# Patient Record
Sex: Female | Born: 1995 | Race: Black or African American | Hispanic: No | Marital: Single | Smoking: Never smoker
Health system: Southern US, Community
[De-identification: ages and names within clinical notes are randomized; demographics above are authoritative.]

## PROBLEM LIST (undated history)

## (undated) DIAGNOSIS — M5430 Sciatica, unspecified side: Secondary | ICD-10-CM

## (undated) DIAGNOSIS — D649 Anemia, unspecified: Secondary | ICD-10-CM

## (undated) HISTORY — PX: WISDOM TOOTH EXTRACTION: SHX21

## (undated) HISTORY — PX: TONSILLECTOMY: SUR1361

---
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
## 2016-07-11 ENCOUNTER — Emergency Department (HOSPITAL_BASED_OUTPATIENT_CLINIC_OR_DEPARTMENT_OTHER): Payer: Medicaid Other

## 2016-07-11 ENCOUNTER — Emergency Department (HOSPITAL_BASED_OUTPATIENT_CLINIC_OR_DEPARTMENT_OTHER)
Admission: EM | Admit: 2016-07-11 | Discharge: 2016-07-11 | Disposition: A | Discharge status: Home / Self Care | Payer: Medicaid Other | Attending: Emergency Medicine | Admitting: Emergency Medicine

## 2016-07-11 ENCOUNTER — Encounter (HOSPITAL_BASED_OUTPATIENT_CLINIC_OR_DEPARTMENT_OTHER): Payer: Self-pay | Admitting: Emergency Medicine

## 2016-07-11 DIAGNOSIS — R0789 Other chest pain: Secondary | ICD-10-CM | POA: Diagnosis not present

## 2016-07-11 DIAGNOSIS — R0602 Shortness of breath: Secondary | ICD-10-CM | POA: Insufficient documentation

## 2016-07-11 LAB — PREGNANCY, URINE: PREG TEST UR: NEGATIVE

## 2016-07-11 NOTE — Discharge Instructions (Signed)

## 2016-07-11 NOTE — ED Provider Notes (Signed)
[facility] Provider Note   CSN: [id number] Arrival date & time: 07/11/16  1348     History   Chief Complaint Chief Complaint  Patient presents with  . Chest Pain    HPI Tracey Dunn is a 21 y.o. female.  21 yo F with a cc of chest pain. Cytologic she was at work. Pain is described as sharp worse with deep breaths. Denies injury denies cough or fever. Denies history of PE or DVT. Denies lower extremity edema. Denies recent surgery or hospitalization. Denies recent travel. Denies oral contraceptive use. Patient is a smoker. Denies family history of MI.   The history is provided by the patient.  Chest Pain   This is a new problem. The current episode started 1 to 2 hours ago. The problem occurs constantly. The problem has not changed since onset.The pain is associated with breathing. The pain is present in the substernal region. The pain is at a severity of 6/10. The pain is moderate. The quality of the pain is described as sharp. The pain does not radiate. Duration of episode(s) is 2 hours. The symptoms are aggravated by deep breathing. Associated symptoms include shortness of breath. Pertinent negatives include no abdominal pain, no dizziness, no fever, no headaches, no nausea, no palpitations and no vomiting. She has tried nothing for the symptoms. The treatment provided no relief. Risk factors include smoking/tobacco exposure.  Pertinent negatives for past medical history include no DVT, no hyperlipidemia, no hypertension, no MI and no PE.    Past Medical History:  Diagnosis Date  . Anemia     Patient Active Problem List   Diagnosis Date Noted  . Anemia 04/01/2015  . Menorrhagia 04/01/2015  . Obesity, morbid, BMI 40.0-49.9 (HCC) 04/01/2015    Past Surgical History:  Procedure Laterality Date  . TONSILLECTOMY    . WISDOM TOOTH EXTRACTION      OB History    Gravida Para Term Preterm AB Living   0 0 0 0 0 0   SAB TAB Ectopic Multiple Live Births   0 0 0 0          Home Medications    Prior to Admission medications   Medication Sig Start Date End Date Taking? Authorizing Provider  ferrous sulfate 325 (65 FE) MG tablet Take 325 mg by mouth daily with breakfast.    Historical Provider, MD  ibuprofen (ADVIL,MOTRIN) 800 MG tablet Take 1 tablet (800 mg total) by mouth 3 (three) times daily. 03/24/15   Eber HongBrian Miller, MD  Norgestimate-Ethinyl Estradiol Triphasic (ORTHO TRI-CYCLEN LO) 0.18/0.215/0.25 MG-25 MCG tab Take 1 tablet by mouth daily. 04/01/15   Adam PhenixJames G Arnold, MD    Family History Family History  Problem Relation Age of Onset  . Hypertension Mother   . Diabetes Mother     Type 1  . Hypertension Maternal Grandmother   . Cancer Neg Hx   . Stroke Neg Hx     Social History Social History  Substance Use Topics  . Smoking status: Never Smoker  . Smokeless tobacco: Never Used  . Alcohol use No     Allergies   Sulfa antibiotics   Review of Systems Review of Systems  Constitutional: Negative for chills and fever.  HENT: Negative for congestion and rhinorrhea.   Eyes: Negative for redness and visual disturbance.  Respiratory: Positive for shortness of breath. Negative for wheezing.   Cardiovascular: Positive for chest pain. Negative for palpitations.  Gastrointestinal: Negative for abdominal pain, nausea and vomiting.  Genitourinary: Negative for dysuria and urgency.  Musculoskeletal: Negative for arthralgias and myalgias.  Skin: Negative for pallor and wound.  Neurological: Negative for dizziness and headaches.     Physical Exam Updated Vital Signs BP 125/72 (BP Location: Left Arm)   Pulse 81   Temp 98.7 F (37.1 C) (Oral)   Resp 17   Ht 5\' 4"  (1.626 m)   Wt 230 lb (104.3 kg)   LMP 06/22/2016 Comment: pt reports irregular cycle  SpO2 100%   BMI 39.48 kg/m   Physical Exam  Constitutional: She is oriented to person, place, and time. She appears well-developed and well-nourished. No distress.  HENT:  Head:  Normocephalic and atraumatic.  Eyes: EOM are normal. Pupils are equal, round, and reactive to light.  Neck: Normal range of motion. Neck supple.  Cardiovascular: Normal rate and regular rhythm.  Exam reveals no gallop and no friction rub.   No murmur heard. Pulmonary/Chest: Effort normal. She has no wheezes. She has no rales. She exhibits tenderness (reproduces pain).  Abdominal: Soft. She exhibits no distension and no mass. There is no tenderness. There is no guarding.  Musculoskeletal: She exhibits no edema or tenderness.  Neurological: She is alert and oriented to person, place, and time.  Skin: Skin is warm and dry. She is not diaphoretic.  Psychiatric: She has a normal mood and affect. Her behavior is normal.  Nursing note and vitals reviewed.    ED Treatments / Results  Labs (all labs ordered are listed, but only abnormal results are displayed) Labs Reviewed  PREGNANCY, URINE    EKG  EKG Interpretation  Date/Time:  Saturday July 11 2016 14:00:27 EDT Ventricular Rate:  88 PR Interval:  126 QRS Duration: 76 QT Interval:  352 QTC Calculation: 425 R Axis:   53 Text Interpretation:  Normal sinus rhythm with sinus arrhythmia Abnormal ECG flipped t waves resolved from prior Otherwise no significant change Confirmed by Charley Lafrance MD, DANIEL [phone]) on 07/11/2016 2:12:35 PM       Radiology Dg Chest 2 View  Result Date: 07/11/2016 CLINICAL DATA:  Chest tightness EXAM: CHEST  2 VIEW COMPARISON:  March 23, 2015 FINDINGS: Lungs are clear. Heart size and pulmonary vascularity are normal. No adenopathy. No pneumothorax. No bone lesions. IMPRESSION: No edema or consolidation. Electronically Signed   By: Bretta Bang III M.D.   On: 07/11/2016 14:44    Procedures Procedures (including critical care time)  Medications Ordered in ED Medications - No data to display   Initial Impression / Assessment and Plan / ED Course  I have reviewed the triage vital signs and the nursing  notes.  Pertinent labs & imaging results that were available during my care of the patient were reviewed by me and considered in my medical decision making (see chart for details).     21 yo F With a chief complaint of chest pain. Reproduced on exam. Suspect muscular skeletal. EKG with no significant finding. PERC negative.   CXR negative, d/c home.  2:59 PM:  I have discussed the diagnosis/risks/treatment options with the patient and family and believe the pt to be eligible for discharge home to follow-up with PCP. We also discussed returning to the ED immediately if new or worsening sx occur. We discussed the sx which are most concerning (e.g., sudden worsening pain, fever, inability to tolerate by mouth) that necessitate immediate return. Medications administered to the patient during their visit and any new prescriptions provided to the patient are listed below.  Medications  given during this visit Medications - No data to display   The patient appears reasonably screen and/or stabilized for discharge and I doubt any other medical condition or other [facility] requiring further screening, evaluation, or treatment in the ED at this time prior to discharge.    Final Clinical Impressions(s) / ED Diagnoses   Final diagnoses:  Chest wall pain    New Prescriptions New Prescriptions   No medications on file     Melene Plan, DO 07/11/16 1459

## 2016-07-11 NOTE — ED Triage Notes (Signed)
Pt reports sudden central chest pain and tightness today around noon, along with some nausea upon standing during initial episode of chest pain and reports pain has been constant since,. Worse with deep inspiration.

## 2016-09-10 DIAGNOSIS — G4733 Obstructive sleep apnea (adult) (pediatric): Secondary | ICD-10-CM | POA: Insufficient documentation

## 2016-09-10 DIAGNOSIS — R0683 Snoring: Secondary | ICD-10-CM | POA: Insufficient documentation

## 2016-09-13 ENCOUNTER — Other Ambulatory Visit: Payer: Self-pay | Admitting: Obstetrics & Gynecology

## 2016-09-13 DIAGNOSIS — Z01419 Encounter for gynecological examination (general) (routine) without abnormal findings: Secondary | ICD-10-CM

## 2016-09-13 DIAGNOSIS — Z3009 Encounter for other general counseling and advice on contraception: Secondary | ICD-10-CM

## 2017-01-29 ENCOUNTER — Emergency Department (HOSPITAL_BASED_OUTPATIENT_CLINIC_OR_DEPARTMENT_OTHER): Payer: Self-pay

## 2017-01-29 ENCOUNTER — Encounter (HOSPITAL_BASED_OUTPATIENT_CLINIC_OR_DEPARTMENT_OTHER): Payer: Self-pay

## 2017-01-29 ENCOUNTER — Emergency Department (HOSPITAL_BASED_OUTPATIENT_CLINIC_OR_DEPARTMENT_OTHER)
Admission: EM | Admit: 2017-01-29 | Discharge: 2017-01-29 | Disposition: A | Discharge status: Home / Self Care | Payer: Self-pay | Attending: Emergency Medicine | Admitting: Emergency Medicine

## 2017-01-29 DIAGNOSIS — R1110 Vomiting, unspecified: Secondary | ICD-10-CM | POA: Insufficient documentation

## 2017-01-29 DIAGNOSIS — R1010 Upper abdominal pain, unspecified: Secondary | ICD-10-CM

## 2017-01-29 DIAGNOSIS — R197 Diarrhea, unspecified: Secondary | ICD-10-CM | POA: Insufficient documentation

## 2017-01-29 DIAGNOSIS — Z79899 Other long term (current) drug therapy: Secondary | ICD-10-CM | POA: Insufficient documentation

## 2017-01-29 DIAGNOSIS — R7989 Other specified abnormal findings of blood chemistry: Secondary | ICD-10-CM

## 2017-01-29 DIAGNOSIS — R945 Abnormal results of liver function studies: Secondary | ICD-10-CM | POA: Insufficient documentation

## 2017-01-29 DIAGNOSIS — A084 Viral intestinal infection, unspecified: Secondary | ICD-10-CM | POA: Insufficient documentation

## 2017-01-29 DIAGNOSIS — R509 Fever, unspecified: Secondary | ICD-10-CM | POA: Insufficient documentation

## 2017-01-29 LAB — COMPREHENSIVE METABOLIC PANEL
ALBUMIN: 3.7 g/dL (ref 3.5–5.0)
ALT: 83 U/L — AB (ref 14–54)
AST: 88 U/L — AB (ref 15–41)
Alkaline Phosphatase: 72 U/L (ref 38–126)
Anion gap: 8 (ref 5–15)
BUN: 6 mg/dL (ref 6–20)
CHLORIDE: 105 mmol/L (ref 101–111)
CO2: 25 mmol/L (ref 22–32)
CREATININE: 0.66 mg/dL (ref 0.44–1.00)
Calcium: 8.7 mg/dL — ABNORMAL LOW (ref 8.9–10.3)
GFR calc Af Amer: >60 mL/min (ref >60)
GFR calc non Af Amer: >60 mL/min (ref >60)
Glucose, Bld: 84 mg/dL (ref 65–99)
Potassium: 3.6 mmol/L (ref 3.5–5.1)
SODIUM: 138 mmol/L (ref 135–145)
Total Bilirubin: 0.6 mg/dL (ref 0.3–1.2)
Total Protein: 8 g/dL (ref 6.5–8.1)

## 2017-01-29 LAB — CBC WITH DIFFERENTIAL/PLATELET
Basophils Absolute: 0 10*3/uL (ref 0.0–0.1)
Basophils Relative: 0 %
EOS PCT: 1 %
Eosinophils Absolute: 0.1 10*3/uL (ref 0.0–0.7)
HEMATOCRIT: 40 % (ref 36.0–46.0)
HEMOGLOBIN: 12.5 g/dL (ref 12.0–15.0)
LYMPHS ABS: 6.4 10*3/uL — AB (ref 0.7–4.0)
LYMPHS PCT: 73 %
MCH: 23.4 pg — ABNORMAL LOW (ref 26.0–34.0)
MCHC: 31.3 g/dL (ref 30.0–36.0)
MCV: 74.9 fL — ABNORMAL LOW (ref 78.0–100.0)
MONOS PCT: 7 %
Monocytes Absolute: 0.6 10*3/uL (ref 0.1–1.0)
NEUTROS ABS: 1.7 10*3/uL (ref 1.7–7.7)
Neutrophils Relative %: 19 %
Platelets: 157 10*3/uL (ref 150–400)
RBC: 5.34 MIL/uL — AB (ref 3.87–5.11)
RDW: 17.5 % — AB (ref 11.5–15.5)
WBC: 8.8 10*3/uL (ref 4.0–10.5)

## 2017-01-29 LAB — LIPASE, BLOOD: Lipase: 24 U/L (ref 11–51)

## 2017-01-29 LAB — URINALYSIS, ROUTINE W REFLEX MICROSCOPIC
Bilirubin Urine: NEGATIVE
Glucose, UA: NEGATIVE mg/dL
HGB URINE DIPSTICK: NEGATIVE
Ketones, ur: NEGATIVE mg/dL
LEUKOCYTES UA: NEGATIVE
Nitrite: NEGATIVE
Protein, ur: NEGATIVE mg/dL
SPECIFIC GRAVITY, URINE: 1.015 (ref 1.005–1.030)
pH: 8.5 — ABNORMAL HIGH (ref 5.0–8.0)

## 2017-01-29 LAB — PREGNANCY, URINE: PREG TEST UR: NEGATIVE

## 2017-01-29 MED ORDER — ONDANSETRON HCL 4 MG/2ML IJ SOLN
4.0000 mg | Freq: Once | INTRAMUSCULAR | Status: AC
  Administered 2017-01-29: 4 mg via INTRAVENOUS
  Filled 2017-01-29: qty 2

## 2017-01-29 MED ORDER — GI COCKTAIL ~~LOC~~
30.0000 mL | Freq: Once | ORAL | Status: AC
  Administered 2017-01-29: 30 mL via ORAL
  Filled 2017-01-29: qty 30

## 2017-01-29 MED ORDER — PANTOPRAZOLE SODIUM 40 MG PO TBEC: 40.0000 mg | DELAYED_RELEASE_TABLET | Freq: Every day | ORAL | 0 refills | Status: DC

## 2017-01-29 MED ORDER — SODIUM CHLORIDE 0.9 % IV BOLUS (SEPSIS)
1000.0000 mL | Freq: Once | INTRAVENOUS | Status: AC
  Administered 2017-01-29: 1000 mL via INTRAVENOUS

## 2017-01-29 MED ORDER — ONDANSETRON 4 MG PO TBDP: 4.0000 mg | ORAL_TABLET | Freq: Three times a day (TID) | ORAL | 0 refills | Status: DC | PRN

## 2017-01-29 NOTE — ED Triage Notes (Signed)
C/o abd pain x 1 week, also c/o "mucus bloody cough then throw up" x 1 last night-NAD-steady gait

## 2017-01-29 NOTE — ED Provider Notes (Signed)
[facility] Provider [facility]   CSN: [id number] Arrival date & time: 01/29/17  1244     History   Chief Complaint Chief Complaint  Patient presents with  . Abdominal Pain    HPI Tracey Dunn is a 21 y.o. female.  HPI  21 year old female presents with upper abdominal pain. She states she's had this pain on and off for about one week. It lasts an hour or 2 when it comes. It feels like a pressure or tightness. Nothing seems to make it come or go. Last night started having diarrhea and has had numerous episodes of loose watery stools. She denies any fevers at home but had a temperature of 100 on arrival here. She had one episode of vomiting prior to coming here and states it was mostly mucus with a very small amount of blood. She states this was less than a teaspoon. There is still pain in her upper abdomen. Eating does not make it better or worse. No urinary symptoms. Pain is a 9/10. No chest pain or shortness of breath.  Past Medical History:  Diagnosis Date  . Anemia     Patient Active Problem List   Diagnosis Date Noted  . Anemia 04/01/2015  . Menorrhagia 04/01/2015  . Obesity, morbid, BMI 40.0-49.9 (HCC) 04/01/2015    Past Surgical History:  Procedure Laterality Date  . TONSILLECTOMY    . WISDOM TOOTH EXTRACTION      OB History    Gravida Para Term Preterm AB Living   0 0 0 0 0 0   SAB TAB Ectopic Multiple Live Births   0 0 0 0         Home Medications    Prior to Admission medications   Medication Sig Start Date End Date Taking? Authorizing Provider  ferrous sulfate 325 (65 FE) MG tablet Take 325 mg by mouth daily with breakfast.    [provider]  ondansetron (ZOFRAN ODT) 4 MG disintegrating tablet Take 1 tablet (4 mg total) by mouth every 8 (eight) hours as needed for nausea or vomiting. 01/29/17   Pricilla Loveless, MD  pantoprazole (PROTONIX) 40 MG tablet Take 1 tablet (40 mg total) by mouth daily. 01/29/17   Pricilla Loveless, MD     Family History Family History  Problem Relation Age of Onset  . Hypertension Mother   . Diabetes Mother        Type 1  . Hypertension Maternal Grandmother   . Cancer Neg Hx   . Stroke Neg Hx     Social History Social History  Substance Use Topics  . Smoking status: Never Smoker  . Smokeless tobacco: Never Used  . Alcohol use No     Allergies   Sulfa antibiotics   Review of Systems Review of Systems  Constitutional: Negative for fever.  Respiratory: Negative for shortness of breath.   Cardiovascular: Negative for chest pain.  Gastrointestinal: Positive for abdominal pain, diarrhea, nausea and vomiting. Negative for blood in stool.  Genitourinary: Negative for dysuria.  Musculoskeletal: Negative for back pain.  All other systems reviewed and are negative.    Physical Exam Updated Vital Signs BP 108/82 (BP Location: Left Arm)   Pulse 78   Temp 98.6 F (37 C) (Oral)   Resp 18   Ht  (1.626 m)   Wt 97.5 kg (215 lb)   LMP  (LMP Unknown)   SpO2 100%   BMI 36.90 kg/m   Physical Exam  Constitutional: She is oriented to  person, place, and time. She appears well-developed and well-nourished. No distress.  obese  HENT:  Head: Normocephalic and atraumatic.  Right Ear: External ear normal.  Left Ear: External ear normal.  Nose: Nose normal.  Eyes: Right eye exhibits no discharge. Left eye exhibits no discharge.  Cardiovascular: Normal rate, regular rhythm and normal heart sounds.   Pulmonary/Chest: Effort normal and breath sounds normal. She has no wheezes. She has no rales. She exhibits no tenderness.  Abdominal: Soft. There is tenderness in the right upper quadrant and epigastric area.  Mild RUQ tenderness, moderate epigastric tenderness  Neurological: She is alert and oriented to person, place, and time.  Skin: Skin is warm and dry. She is not diaphoretic.  Nursing note and vitals reviewed.    ED Treatments / Results  Labs (all labs ordered are  listed, but only abnormal results are displayed) Labs Reviewed  URINALYSIS, ROUTINE W REFLEX MICROSCOPIC - Abnormal; Notable for the following:       Result Value   pH 8.5 (*)    All other components within normal limits  COMPREHENSIVE METABOLIC PANEL - Abnormal; Notable for the following:    Calcium 8.7 (*)    AST 88 (*)    ALT 83 (*)    All other components within normal limits  CBC WITH DIFFERENTIAL/PLATELET - Abnormal; Notable for the following:    RBC 5.34 (*)    MCV 74.9 (*)    MCH 23.4 (*)    RDW 17.5 (*)    Lymphs Abs 6.4 (*)    All other components within normal limits  PREGNANCY, URINE  LIPASE, BLOOD  PATHOLOGIST SMEAR REVIEW    EKG  EKG Interpretation None       Radiology US Abdomen Limited Ruq  Result Date: 01/29/2017 CLINICAL DATA:  Epigastric pain. EXAM: ULTRASOUND ABDOMEN LIMITED RIGHT UPPER QUADRANT COMPARISON:  No recent prior . FINDINGS: Gallbladder: No gallstones or wall thickening visualized. No sonographic Murphy sign noted by sonographer. Common bile duct: Diameter: 2 mm Liver: No focal lesion identified. Within normal limits in parenchymal echogenicity. Portal vein is patent on color Doppler imaging with normal direction of blood flow towards the liver. IMPRESSION: Negative exam. Electronically Signed   By: Maisie Fus  Register   On: 01/29/2017 17:33    Procedures Procedures (including critical care time)  Medications Ordered in ED Medications  sodium chloride 0.9 % bolus 1,000 mL (0 mLs Intravenous Stopped 01/29/17 1805)  ondansetron (ZOFRAN) injection 4 mg (4 mg Intravenous Given 01/29/17 1610)  gi cocktail (Maalox,Lidocaine,Donnatal) (30 mLs Oral Given 01/29/17 1612)     Initial Impression / Assessment and Plan / ED Course  I have reviewed the triage vital signs and the nursing notes.  Pertinent labs & imaging results that were available during my care of the patient were reviewed by me and considered in my medical decision making (see chart  for details).     Patient's upper abdominal pain is likely gastritis. She will be treated with PPI. Given some right upper quadrant tenderness, ultrasound obtained which is unremarkable. She has minimal AST/ALT elevation. Will instruct not to take Tylenol or drink alcohol. Will need to follow-up with a PCP for recheck to ensure these are coming down. This could be from a mild gastroenteritis or mild hepatitis. Otherwise, the patient is well-appearing and feels better after treatments. With the diarrhea I think this is likely gastroenteritis. The one episode of vomiting she states was somewhat forceful and had a very minimal amount of blood.  Likely Mallory-Weiss. No further vomiting or hematemesis. Discussed return precautions.  Final Clinical Impressions(s) / ED Diagnoses   Final diagnoses:  Upper abdominal pain  Viral gastroenteritis  Abnormal LFTs    New Prescriptions Discharge Medication List as of 01/29/2017  5:53 PM    START taking these medications   Details  ondansetron (ZOFRAN ODT) 4 MG disintegrating tablet Take 1 tablet (4 mg total) by mouth every 8 (eight) hours as needed for nausea or vomiting., Starting Fri 01/29/2017, Print    pantoprazole (PROTONIX) 40 MG tablet Take 1 tablet (40 mg total) by mouth daily., Starting Fri 01/29/2017, Print         Pricilla Loveless, MD 01/29/17 1820

## 2017-01-29 NOTE — ED Notes (Signed)
Pt discharged to home with family. NAD.  

## 2017-01-29 NOTE — Discharge Instructions (Signed)
Your liver tests are mildly elevated today. Do NOT take TYLENOL/ACETAMINOPHEN as this is absorbed by the liver. Do not drink alcohol. See a primary care doctor for a recheck of your liver tests in about 1 week. Return to the ER if you develop worsening pain, vomiting or other new/concerning symptoms.

## 2017-01-29 NOTE — ED Notes (Signed)
Pt reports epigastric pain for a week diarrhea that started yesterday, vomiting started today. PT reports at least 10 episodes of diarrhea and vomited one time today.

## 2017-02-01 LAB — PATHOLOGIST SMEAR REVIEW: PATH REVIEW: REACTIVE

## 2017-06-30 ENCOUNTER — Encounter (HOSPITAL_BASED_OUTPATIENT_CLINIC_OR_DEPARTMENT_OTHER): Payer: Self-pay | Admitting: Emergency Medicine

## 2017-06-30 ENCOUNTER — Other Ambulatory Visit: Payer: Self-pay

## 2017-06-30 ENCOUNTER — Emergency Department (HOSPITAL_BASED_OUTPATIENT_CLINIC_OR_DEPARTMENT_OTHER)
Admission: EM | Admit: 2017-06-30 | Discharge: 2017-06-30 | Disposition: A | Discharge status: Other Acute Inpatient Hospital | Payer: Self-pay | Attending: Emergency Medicine | Admitting: Emergency Medicine

## 2017-06-30 ENCOUNTER — Emergency Department (HOSPITAL_BASED_OUTPATIENT_CLINIC_OR_DEPARTMENT_OTHER): Payer: Self-pay

## 2017-06-30 DIAGNOSIS — R202 Paresthesia of skin: Secondary | ICD-10-CM

## 2017-06-30 DIAGNOSIS — Z79899 Other long term (current) drug therapy: Secondary | ICD-10-CM | POA: Insufficient documentation

## 2017-06-30 DIAGNOSIS — N390 Urinary tract infection, site not specified: Secondary | ICD-10-CM | POA: Insufficient documentation

## 2017-06-30 LAB — URINALYSIS, ROUTINE W REFLEX MICROSCOPIC
Bilirubin Urine: NEGATIVE
Glucose, UA: NEGATIVE mg/dL
Ketones, ur: 15 mg/dL — AB
Nitrite: NEGATIVE
PROTEIN: 30 mg/dL — AB
Specific Gravity, Urine: >1.03 — ABNORMAL HIGH (ref 1.005–1.030)
pH: 6.5 (ref 5.0–8.0)

## 2017-06-30 LAB — CBC WITH DIFFERENTIAL/PLATELET
Basophils Absolute: 0 K/uL (ref 0.0–0.1)
Basophils Relative: 0 %
Eosinophils Absolute: 0.2 K/uL (ref 0.0–0.7)
Eosinophils Relative: 2 %
HCT: 38.1 % (ref 36.0–46.0)
Hemoglobin: 12 g/dL (ref 12.0–15.0)
Lymphocytes Relative: 43 %
Lymphs Abs: 3.7 K/uL (ref 0.7–4.0)
MCH: 24 pg — ABNORMAL LOW (ref 26.0–34.0)
MCHC: 31.5 g/dL (ref 30.0–36.0)
MCV: 76 fL — ABNORMAL LOW (ref 78.0–100.0)
Monocytes Absolute: 0.7 K/uL (ref 0.1–1.0)
Monocytes Relative: 8 %
Neutro Abs: 4 K/uL (ref 1.7–7.7)
Neutrophils Relative %: 47 %
Platelets: 232 K/uL (ref 150–400)
RBC: 5.01 MIL/uL (ref 3.87–5.11)
RDW: 15.5 % (ref 11.5–15.5)
WBC: 8.6 K/uL (ref 4.0–10.5)

## 2017-06-30 LAB — URINALYSIS, MICROSCOPIC (REFLEX)

## 2017-06-30 LAB — COMPREHENSIVE METABOLIC PANEL
ALT: 20 U/L (ref 14–54)
AST: 21 U/L (ref 15–41)
Albumin: 3.8 g/dL (ref 3.5–5.0)
Alkaline Phosphatase: 67 U/L (ref 38–126)
Anion gap: 8 (ref 5–15)
BUN: 12 mg/dL (ref 6–20)
CHLORIDE: 106 mmol/L (ref 101–111)
CO2: 25 mmol/L (ref 22–32)
CREATININE: 0.64 mg/dL (ref 0.44–1.00)
Calcium: 8.9 mg/dL (ref 8.9–10.3)
Glucose, Bld: 88 mg/dL (ref 65–99)
POTASSIUM: 3.7 mmol/L (ref 3.5–5.1)
Sodium: 139 mmol/L (ref 135–145)
Total Bilirubin: 0.4 mg/dL (ref 0.3–1.2)
Total Protein: 7.6 g/dL (ref 6.5–8.1)

## 2017-06-30 LAB — MAGNESIUM: MAGNESIUM: 2.1 mg/dL (ref 1.7–2.4)

## 2017-06-30 LAB — PREGNANCY, URINE: Preg Test, Ur: NEGATIVE

## 2017-06-30 MED ORDER — CEPHALEXIN 250 MG PO CAPS
500.0000 mg | ORAL_CAPSULE | Freq: Once | ORAL | Status: AC
  Administered 2017-06-30: 500 mg via ORAL
  Filled 2017-06-30: qty 2

## 2017-06-30 NOTE — ED Notes (Signed)
Pt in room on monitor up to BR to void

## 2017-06-30 NOTE — ED Notes (Signed)
MD stated that patient can go to [facility] by POV.

## 2017-06-30 NOTE — ED Notes (Signed)
[facility]. [facility] going [facility] for [facility] via POV.

## 2017-06-30 NOTE — ED Notes (Signed)
Pt has not arrived at [facility] ED at this time. Pt taken off track board.

## 2017-06-30 NOTE — ED Triage Notes (Signed)
Left arm and hand numb started today about 45 mins ago  Neg VAN has equal grip , no slurred speech ,  Can feel equal and well both arms and hands   States got lightheaded, pt drove herself here she states

## 2017-06-30 NOTE — ED Provider Notes (Signed)
[facility] EMERGENCY DEPARTMENT Provider Note   CSN: [id number] Arrival date & time: 06/30/17  1402     History   Chief Complaint Chief Complaint  Patient presents with  . Numbness    HPI Tracey Dunn is a 22 y.o. female.  HPI Patient presents with left-sided numbness that started today while at work.  Patient states the symptoms started around 1 PM.  Denies any visual or speech changes.  States the numbness in her leg has improved though she still has numbness in her left arm.  Question mild weakness in left hand.  No recent injuries.  No fever or chills.  No previously similar symptoms.  Denies headache. Past Medical History:  Diagnosis Date  . Anemia     Patient Active Problem List   Diagnosis Date Noted  . Anemia 04/01/2015  . Menorrhagia 04/01/2015  . Obesity, morbid, BMI 40.0-49.9 (HCC) 04/01/2015    Past Surgical History:  Procedure Laterality Date  . TONSILLECTOMY    . WISDOM TOOTH EXTRACTION      OB History    Gravida Para Term Preterm AB Living   0 0 0 0 0 0   SAB TAB Ectopic Multiple Live Births   0 0 0 0         Home Medications    Prior to Admission medications   Medication Sig Start Date End Date Taking? Authorizing Provider  ferrous sulfate 325 (65 FE) MG tablet Take 325 mg by mouth daily with breakfast.    [provider]  ondansetron (ZOFRAN ODT) 4 MG disintegrating tablet Take 1 tablet (4 mg total) by mouth every 8 (eight) hours as needed for nausea or vomiting. 01/29/17   Pricilla LovelessGoldston, Scott, MD  pantoprazole (PROTONIX) 40 MG tablet Take 1 tablet (40 mg total) by mouth daily. 01/29/17   Pricilla LovelessGoldston, Scott, MD    Family History Family History  Problem Relation Age of Onset  . Hypertension Mother   . Diabetes Mother        Type 1  . Hypertension Maternal Grandmother   . Cancer Neg Hx   . Stroke Neg Hx     Social History Social History   Tobacco Use  . Smoking status: Never Smoker  . Smokeless tobacco: Never Used    Substance Use Topics  . Alcohol use: No  . Drug use: No     Allergies   Sulfa antibiotics   Review of Systems Review of Systems  Constitutional: Negative for chills, fatigue and fever.  HENT: Negative for sinus pressure and trouble swallowing.   Eyes: Negative for visual disturbance.  Respiratory: Negative for cough and shortness of breath.   Cardiovascular: Negative for chest pain, palpitations and leg swelling.  Gastrointestinal: Negative for abdominal pain, diarrhea, nausea and vomiting.  Musculoskeletal: Negative for arthralgias, back pain, gait problem, myalgias and neck pain.  Skin: Negative for rash and wound.  Neurological: Positive for weakness and numbness. Negative for dizziness, syncope, speech difficulty, light-headedness and headaches.  All other systems reviewed and are negative.    Physical Exam Updated Vital Signs BP 127/89   Pulse (!) 101   Temp 98.1 F (36.7 C) (Oral)   Resp 20   Ht 5\' 5"  (1.651 m)   Wt 119.4 kg (263 lb 3.7 oz)   LMP 06/05/2017   SpO2 100%   BMI 43.80 kg/m   Physical Exam  Constitutional: She is oriented to person, place, and time. She appears well-developed and well-nourished. No distress.  HENT:  Head: Normocephalic and atraumatic.  Mouth/Throat: Oropharynx is clear and moist. No oropharyngeal exudate.  Cranial nerves II through XII intact.  Eyes: EOM are normal. Pupils are equal, round, and reactive to light.  Neck: Normal range of motion. Neck supple. No JVD present. No tracheal deviation present. No thyromegaly present.  No Meningismus.  No posterior midline cervical tenderness to palpation.  Cardiovascular: Normal rate and regular rhythm. Exam reveals no gallop and no friction rub.  No murmur heard. Pulmonary/Chest: Effort normal and breath sounds normal. No stridor. No respiratory distress. She has no wheezes. She has no rales. She exhibits no tenderness.  Abdominal: Soft. Bowel sounds are normal. There is no tenderness.  There is no rebound and no guarding.  Musculoskeletal: Normal range of motion. She exhibits no edema or tenderness.  Distal pulses are 2+.  No extremity swelling or asymmetry.  Lymphadenopathy:    She has no cervical adenopathy.  Neurological: She is alert and oriented to person, place, and time.  Question mild left arm weakness compared to right.  Decreased sensation to light touch in the left arm compared to right.  5/5 bilateral lower extremity strength.  Sensation intact.  Skin: Skin is warm and dry. Capillary refill takes less than 2 seconds. No rash noted. She is not diaphoretic. No erythema.  Psychiatric: She has a normal mood and affect. Her behavior is normal.  Nursing note and vitals reviewed.    ED Treatments / Results  Labs (all labs ordered are listed, but only abnormal results are displayed) Labs Reviewed  CBC WITH DIFFERENTIAL/PLATELET - Abnormal; Notable for the following components:      Result Value   MCV 76.0 (*)    MCH 24.0 (*)    All other components within normal limits  URINALYSIS, ROUTINE W REFLEX MICROSCOPIC - Abnormal; Notable for the following components:   APPearance CLOUDY (*)    Specific Gravity, Urine >1.030 (*)    Hgb urine dipstick TRACE (*)    Ketones, ur 15 (*)    Protein, ur 30 (*)    Leukocytes, UA MODERATE (*)    All other components within normal limits  URINALYSIS, MICROSCOPIC (REFLEX) - Abnormal; Notable for the following components:   Bacteria, UA MANY (*)    Squamous Epithelial / LPF 6-30 (*)    All other components within normal limits  COMPREHENSIVE METABOLIC PANEL  MAGNESIUM  PREGNANCY, URINE    EKG  EKG Interpretation  Date/Time:  Wednesday June 30 2017 14:14:55 EDT Ventricular Rate:  102 PR Interval:  128 QRS Duration: 76 QT Interval:  328 QTC Calculation: 427 R Axis:   50 Text Interpretation:  Sinus tachycardia Otherwise normal ECG Confirmed by Loren Racer ([id number]) on 06/30/2017 7:10:12 PM       Radiology Ct  Head Wo Contrast  Result Date: 06/30/2017 CLINICAL DATA:  Left sided body numbness EXAM: CT HEAD WITHOUT CONTRAST TECHNIQUE: Contiguous axial images were obtained from the base of the skull through the vertex without intravenous contrast. COMPARISON:  None. FINDINGS: Brain: The ventricles are normal in size and configuration. There is no intracranial mass, hemorrhage, extra-axial fluid collection, or midline shift. The gray-white compartments are normal. No evident acute infarct. Vascular: No hyperdense vessel evident. No appreciable vascular calcification. Skull: The bony calvarium appears intact. Sinuses/Orbits: Visualized paranasal sinuses are clear. Visualized orbits appear symmetric bilaterally. Other: Visualized mastoid air cells are clear. IMPRESSION: Study within normal limits. Electronically Signed   By: Bretta Bang III M.D.   On: 06/30/2017 15:04  Procedures Procedures (including critical care time)  Medications Ordered in ED Medications  cephALEXin (KEFLEX) capsule 500 mg (500 mg Oral Given 06/30/17 1858)     Initial Impression / Assessment and Plan / ED Course  I have reviewed the triage vital signs and the nursing notes.  Pertinent labs & imaging results that were available during my care of the patient were reviewed by me and considered in my medical decision making (see chart for details).    CT head is normal.  Patient continues to have mostly sensory deficits in the left arm.  Possibly mild left grip strength weakness.  Very low suspicion for stroke in this 22 year old without significant risk factors.  She does have evidence of urinary tract infection on workup.  Will arrange to have patient get MRI at [facility] this evening.  Discussed with Dr. Rush Landmark, ER physician.  Aware that the patient is being transferred.  Patient is requesting to go by private vehicle.  She has a friend to drive.  Given first dose of Keflex in the emergency department.  Will need prescription  for 5 days of Keflex and outpatient referral to neurology if her MRI is normal.   Final Clinical Impressions(s) / ED Diagnoses   Final diagnoses:  Paresthesia  Acute lower UTI    ED Discharge Orders    None       Loren Racer, MD 06/30/17 1912

## 2017-09-23 DIAGNOSIS — E663 Overweight: Secondary | ICD-10-CM | POA: Insufficient documentation

## 2017-09-26 ENCOUNTER — Encounter (HOSPITAL_BASED_OUTPATIENT_CLINIC_OR_DEPARTMENT_OTHER): Payer: Self-pay | Admitting: *Deleted

## 2017-09-26 ENCOUNTER — Emergency Department (HOSPITAL_BASED_OUTPATIENT_CLINIC_OR_DEPARTMENT_OTHER)
Admission: EM | Admit: 2017-09-26 | Discharge: 2017-09-26 | Disposition: A | Discharge status: Home / Self Care | Payer: Self-pay | Attending: Emergency Medicine | Admitting: Emergency Medicine

## 2017-09-26 ENCOUNTER — Other Ambulatory Visit: Payer: Self-pay

## 2017-09-26 DIAGNOSIS — Z79899 Other long term (current) drug therapy: Secondary | ICD-10-CM | POA: Insufficient documentation

## 2017-09-26 DIAGNOSIS — N390 Urinary tract infection, site not specified: Secondary | ICD-10-CM | POA: Insufficient documentation

## 2017-09-26 DIAGNOSIS — M545 Low back pain, unspecified: Secondary | ICD-10-CM

## 2017-09-26 LAB — URINALYSIS, ROUTINE W REFLEX MICROSCOPIC
BILIRUBIN URINE: NEGATIVE
GLUCOSE, UA: NEGATIVE mg/dL
KETONES UR: NEGATIVE mg/dL
NITRITE: NEGATIVE
PH: 5.5 (ref 5.0–8.0)
Protein, ur: NEGATIVE mg/dL
Specific Gravity, Urine: >1.03 — ABNORMAL HIGH (ref 1.005–1.030)

## 2017-09-26 LAB — URINALYSIS, MICROSCOPIC (REFLEX)

## 2017-09-26 LAB — PREGNANCY, URINE: Preg Test, Ur: NEGATIVE

## 2017-09-26 MED ORDER — IBUPROFEN 400 MG PO TABS
600.0000 mg | ORAL_TABLET | Freq: Once | ORAL | Status: AC
  Administered 2017-09-26: 01:00:00 600 mg via ORAL
  Filled 2017-09-26: qty 1

## 2017-09-26 MED ORDER — ONDANSETRON 4 MG PO TBDP: 4.0000 mg | ORAL_TABLET | Freq: Three times a day (TID) | ORAL | 0 refills | Status: DC | PRN

## 2017-09-26 MED ORDER — CEPHALEXIN 250 MG PO CAPS
500.0000 mg | ORAL_CAPSULE | Freq: Once | ORAL | Status: AC
  Administered 2017-09-26: 500 mg via ORAL
  Filled 2017-09-26: qty 2

## 2017-09-26 MED ORDER — IBUPROFEN 600 MG PO TABS: 600.0000 mg | ORAL_TABLET | Freq: Four times a day (QID) | ORAL | 0 refills | Status: DC | PRN

## 2017-09-26 MED ORDER — CEPHALEXIN 500 MG PO CAPS: 500.0000 mg | ORAL_CAPSULE | Freq: Three times a day (TID) | ORAL | 0 refills | Status: AC

## 2017-09-26 NOTE — ED Notes (Signed)
EDP at BS 

## 2017-09-26 NOTE — ED Triage Notes (Signed)
Here for L flank pain, onset ~2 weeks ago, was intermittent, now constant since last night, also mentions urinary frequency, no meds PTA, (denies other sx, including: NVD, fever, sob, dizziness, constipation, urgency, dysuria, hematuria, vaginal d/c, or abd pain). No PCP.   Alert, NAD, calm, interactive, resps e/u, speaking in clear complete sentences, no dyspnea noted, skin W&D, VSS.

## 2017-09-26 NOTE — ED Provider Notes (Signed)
[facility] EMERGENCY DEPARTMENT Provider Note   CSN: [id number] Arrival date & time: 09/26/17  0009     History   Chief Complaint Chief Complaint  Patient presents with  . Back Pain    HPI Tracey Dunn is a 22 y.o. female.  HPI   22 yo F here with left lower back pain. Pt states that 3 weeks ago, she was at work when she developed an aching, throbbing, positional left-sided lower back pain. Pain improved off work, but has since returned often when at work. She works at [employer] with frequent lifting/twisting. Pain does seem worse with movement and palpation. No alleviating factors and has not taken anything for it. Earlier today, sx began to occur at rest so she presents for evaluation. No midline pain. No radiation down the leg or back. Of note, tp does report associated urinary frequency and has a h/o UTIs. No vomiting, fever, or known h/o kidney stones or pyelonephritis. She is not currently sexually active and denies any vaginal bleeding or discharge or other complaints.  Past Medical History:  Diagnosis Date  . Anemia     Patient Active Problem List   Diagnosis Date Noted  . Anemia 04/01/2015  . Menorrhagia 04/01/2015  . Obesity, morbid, BMI 40.0-49.9 (HCC) 04/01/2015    Past Surgical History:  Procedure Laterality Date  . TONSILLECTOMY    . WISDOM TOOTH EXTRACTION       OB History    Gravida  0   Para  0   Term  0   Preterm  0   AB  0   Living  0     SAB  0   TAB  0   Ectopic  0   Multiple  0   Live Births               Home Medications    Prior to Admission medications   Medication Sig Start Date End Date Taking? Authorizing Provider  cephALEXin (KEFLEX) 500 MG capsule Take 1 capsule (500 mg total) by mouth 3 (three) times daily for 10 days. 09/26/17 10/06/17  Shaune Pollack, MD  ferrous sulfate 325 (65 FE) MG tablet Take 325 mg by mouth daily with breakfast.    [provider]  ibuprofen (ADVIL,MOTRIN) 600 MG tablet  Take 1 tablet (600 mg total) by mouth every 6 (six) hours as needed for moderate pain. 09/26/17   Shaune Pollack, MD  ondansetron (ZOFRAN ODT) 4 MG disintegrating tablet Take 1 tablet (4 mg total) by mouth every 8 (eight) hours as needed for nausea or vomiting. 09/26/17   Shaune Pollack, MD  pantoprazole (PROTONIX) 40 MG tablet Take 1 tablet (40 mg total) by mouth daily. 01/29/17   Pricilla Loveless, MD    Family History Family History  Problem Relation Age of Onset  . Hypertension Mother   . Diabetes Mother        Type 1  . Hypertension Maternal Grandmother   . Cancer Neg Hx   . Stroke Neg Hx     Social History Social History   Tobacco Use  . Smoking status: Never Smoker  . Smokeless tobacco: Never Used  Substance Use Topics  . Alcohol use: No  . Drug use: No     Allergies   Sulfa antibiotics   Review of Systems Review of Systems  Constitutional: Negative for chills and fever.  HENT: Negative for congestion, rhinorrhea and sore throat.   Eyes: Negative for visual disturbance.  Respiratory: Negative  for cough, shortness of breath and wheezing.   Cardiovascular: Negative for chest pain and leg swelling.  Gastrointestinal: Negative for abdominal pain, diarrhea, nausea and vomiting.  Genitourinary: Positive for frequency. Negative for dysuria, flank pain, vaginal bleeding and vaginal discharge.  Musculoskeletal: Positive for back pain. Negative for neck pain.  Skin: Negative for rash.  Allergic/Immunologic: Negative for immunocompromised state.  Neurological: Negative for syncope and headaches.  Hematological: Does not bruise/bleed easily.  All other systems reviewed and are negative.    Physical Exam Updated Vital Signs BP 137/79 (BP Location: Right Arm)   Pulse 97   Temp 98.7 F (37.1 C) (Oral)   Resp 18   Ht 5\' 4"  (1.626 m)   Wt 117.9 kg (260 lb)   LMP 08/18/2017 (Exact Date)   SpO2 97%   BMI 44.63 kg/m   Physical Exam  Constitutional: She is oriented to  person, place, and time. She appears well-developed and well-nourished. No distress.  HENT:  Head: Normocephalic and atraumatic.  Eyes: Conjunctivae are normal.  Neck: Neck supple.  Cardiovascular: Normal rate, regular rhythm and normal heart sounds. Exam reveals no friction rub.  No murmur heard. Pulmonary/Chest: Effort normal and breath sounds normal. No respiratory distress. She has no wheezes. She has no rales.  Abdominal: Soft. Normal appearance. She exhibits no distension. There is no tenderness. There is no CVA tenderness.  No abd TTP. No CVAT.  Musculoskeletal: She exhibits no edema.       Back:  No midline lower back pain or TTP.   Neurological: She is alert and oriented to person, place, and time. She exhibits normal muscle tone.  Distal strength and sensation intact b/l LE. Gait normal.  Skin: Skin is warm. Capillary refill takes less than 2 seconds.  Psychiatric: She has a normal mood and affect.  Nursing note and vitals reviewed.    ED Treatments / Results  Labs (all labs ordered are listed, but only abnormal results are displayed) Labs Reviewed  URINALYSIS, ROUTINE W REFLEX MICROSCOPIC - Abnormal; Notable for the following components:      Result Value   APPearance CLOUDY (*)    Specific Gravity, Urine >1.030 (*)    Hgb urine dipstick SMALL (*)    Leukocytes, UA MODERATE (*)    All other components within normal limits  URINALYSIS, MICROSCOPIC (REFLEX) - Abnormal; Notable for the following components:   Bacteria, UA MANY (*)    All other components within normal limits  URINE CULTURE  PREGNANCY, URINE    EKG None  Radiology No results found.  Procedures Procedures (including critical care time)  Medications Ordered in ED Medications  cephALEXin (KEFLEX) capsule 500 mg (500 mg Oral Given 09/26/17 0114)  ibuprofen (ADVIL,MOTRIN) tablet 600 mg (600 mg Oral Given 09/26/17 0114)     Initial Impression / Assessment and Plan / ED Course  I have reviewed the  triage vital signs and the nursing notes.  Pertinent labs & imaging results that were available during my care of the patient were reviewed by me and considered in my medical decision making (see chart for details).     22 yo F here with left lower back pain. Suspect her primary pain is 2/2 MSK strain in setting of work injury. This occurred and worsens while at work and is reproducible upon palpation of paraspinal musculature. No midline TTP. No LE weakness, numbness, or signs of radiculopathy or cord compression. Of note, tp also c/o some urinary frequency. UA does show pyuria, bacteria  though may be contaminant. She has no fever, no vomiting, no overt CVAT and while I suspect her back pain is MSK in etiology, given her sx will tx with Keflex to be safe. She is adamant she has no vaginal bleeding, discharge, or concern for PID or STD. D/c home with good return precautions.  Final Clinical Impressions(s) / ED Diagnoses   Final diagnoses:  Acute left-sided low back pain without sciatica  Lower urinary tract infectious disease    ED Discharge Orders        Ordered    cephALEXin (KEFLEX) 500 MG capsule  3 times daily     09/26/17 0104    ibuprofen (ADVIL,MOTRIN) 600 MG tablet  Every 6 hours PRN     09/26/17 0104    ondansetron (ZOFRAN ODT) 4 MG disintegrating tablet  Every 8 hours PRN     09/26/17 0104       Shaune Pollack, MD 09/26/17 [phone]

## 2017-09-27 LAB — URINE CULTURE: SPECIAL REQUESTS: NORMAL

## 2018-01-18 ENCOUNTER — Emergency Department (HOSPITAL_BASED_OUTPATIENT_CLINIC_OR_DEPARTMENT_OTHER)
Admission: EM | Admit: 2018-01-18 | Discharge: 2018-01-18 | Disposition: A | Discharge status: Home / Self Care | Payer: Self-pay | Attending: Emergency Medicine | Admitting: Emergency Medicine

## 2018-01-18 ENCOUNTER — Encounter (HOSPITAL_BASED_OUTPATIENT_CLINIC_OR_DEPARTMENT_OTHER): Payer: Self-pay

## 2018-01-18 ENCOUNTER — Other Ambulatory Visit: Payer: Self-pay

## 2018-01-18 DIAGNOSIS — J069 Acute upper respiratory infection, unspecified: Secondary | ICD-10-CM

## 2018-01-18 DIAGNOSIS — Z79899 Other long term (current) drug therapy: Secondary | ICD-10-CM | POA: Insufficient documentation

## 2018-01-18 NOTE — Discharge Instructions (Signed)
If you develop high fever, vomiting, inability to swallow, shortness of breath, neck stiffness, trouble speaking, or any other new/concerning symptoms then return to the ER for evaluation.

## 2018-01-18 NOTE — ED Triage Notes (Signed)
C/o flu like sx x today-NAD-steady gait 

## 2018-01-18 NOTE — ED Notes (Signed)
Pt/family verbalized understanding of discharge instructions.   

## 2018-01-18 NOTE — ED Provider Notes (Addendum)
[facility] EMERGENCY DEPARTMENT Provider Note   CSN: [id number] Arrival date & time: 01/18/18  1339     History   Chief Complaint Chief Complaint  Patient presents with  . Cough    HPI Tracey Dunn is a 22 y.o. female.  HPI  22 year old female presents with sore throat since this morning.  Feels tight.  She has painful swallowing but no difficulty swallowing or trouble speaking.  No dyspnea.  Has felt subjective warmth and chills but no documented fever.  A little bit of headache.  Had some ear pain yesterday but none today.  Has had a mild intermittent cough and some nasal congestion.  No abdominal pain, vomiting.  Has not taken anything for the symptoms.  Past Medical History:  Diagnosis Date  . Anemia     Patient Active Problem List   Diagnosis Date Noted  . Anemia 04/01/2015  . Menorrhagia 04/01/2015  . Obesity, morbid, BMI 40.0-49.9 (HCC) 04/01/2015    Past Surgical History:  Procedure Laterality Date  . TONSILLECTOMY    . WISDOM TOOTH EXTRACTION       OB History    Gravida  0   Para  0   Term  0   Preterm  0   AB  0   Living  0     SAB  0   TAB  0   Ectopic  0   Multiple  0   Live Births               Home Medications    Prior to Admission medications   Medication Sig Start Date End Date Taking? Authorizing Provider  ferrous sulfate 325 (65 FE) MG tablet Take 325 mg by mouth daily with breakfast.    [provider]  ibuprofen (ADVIL,MOTRIN) 600 MG tablet Take 1 tablet (600 mg total) by mouth every 6 (six) hours as needed for moderate pain. 09/26/17   Shaune Pollack, MD  ondansetron (ZOFRAN ODT) 4 MG disintegrating tablet Take 1 tablet (4 mg total) by mouth every 8 (eight) hours as needed for nausea or vomiting. 09/26/17   Shaune Pollack, MD  pantoprazole (PROTONIX) 40 MG tablet Take 1 tablet (40 mg total) by mouth daily. 01/29/17   Pricilla Loveless, MD    Family History Family History  Problem Relation Age of  Onset  . Hypertension Mother   . Diabetes Mother        Type 1  . Hypertension Maternal Grandmother   . Cancer Neg Hx   . Stroke Neg Hx     Social History Social History   Tobacco Use  . Smoking status: Never Smoker  . Smokeless tobacco: Never Used  Substance Use Topics  . Alcohol use: Yes    Comment: occ  . Drug use: No     Allergies   Sulfa antibiotics   Review of Systems Review of Systems  Constitutional: Positive for chills and fever (subjective).  HENT: Positive for congestion, sore throat and trouble swallowing (painful). Negative for ear pain and voice change.   Respiratory: Positive for cough. Negative for shortness of breath.   Gastrointestinal: Negative for vomiting.  Neurological: Positive for headaches.     Physical Exam Updated Vital Signs BP 132/80 (BP Location: Right Arm)   Pulse 85   Temp 98.8 F (37.1 C) (Oral)   Resp 20   Ht 5\' 5"  (1.651 m)   Wt 117.5 kg   LMP 01/13/2018   SpO2 100%   BMI  43.10 kg/m   Physical Exam  Constitutional: She appears well-developed and well-nourished. No distress.  obese  HENT:  Head: Normocephalic and atraumatic.  Right Ear: Tympanic membrane and external ear normal.  Left Ear: Tympanic membrane and external ear normal.  Nose: Nose normal.  Mouth/Throat: Uvula is midline. No oropharyngeal exudate, posterior oropharyngeal erythema or tonsillar abscesses.  Eyes: Right eye exhibits no discharge. Left eye exhibits no discharge.  Neck: Normal range of motion. Neck supple.  Cardiovascular: Normal rate, regular rhythm and normal heart sounds.  Pulmonary/Chest: Effort normal and breath sounds normal. No stridor. She has no rales.  Abdominal: Soft. There is no tenderness.  Lymphadenopathy:    She has no cervical adenopathy.  Neurological: She is alert.  Skin: Skin is warm and dry. She is not diaphoretic.  Psychiatric: Her mood appears not anxious.  Nursing note and vitals reviewed.    ED Treatments / Results   Labs (all labs ordered are listed, but only abnormal results are displayed) Labs Reviewed - No data to display  EKG None  Radiology No results found.  Procedures Procedures (including critical care time)  Medications Ordered in ED Medications - No data to display   Initial Impression / Assessment and Plan / ED Course  I have reviewed the triage vital signs and the nursing notes.  Pertinent labs & imaging results that were available during my care of the patient were reviewed by me and considered in my medical decision making (see chart for details).     Patient is well-appearing and her vital signs are unremarkable.  Oxygen is normal and she has no significant increased work of breathing or abnormal lung sounds.  Her oropharyngeal exam is pretty unremarkable and I highly doubt deep space neck infection.  This appears to be mild viral pharyngitis/viral URI.  CENTOR is 0. Appears stable for symptomatic care.  Discharged home with return precautions.  Final Clinical Impressions(s) / ED Diagnoses   Final diagnoses:  Viral upper respiratory infection    ED Discharge Orders    None       Pricilla Loveless, MD 01/18/18 1453    Pricilla Loveless, MD 01/18/18 1517

## 2018-01-18 NOTE — ED Notes (Signed)
ED Provider at bedside. 

## 2018-02-26 ENCOUNTER — Other Ambulatory Visit: Payer: Self-pay

## 2018-02-26 ENCOUNTER — Emergency Department (HOSPITAL_BASED_OUTPATIENT_CLINIC_OR_DEPARTMENT_OTHER)
Admission: EM | Admit: 2018-02-26 | Discharge: 2018-02-26 | Disposition: A | Discharge status: Home / Self Care | Payer: Self-pay | Attending: Emergency Medicine | Admitting: Emergency Medicine

## 2018-02-26 ENCOUNTER — Encounter (HOSPITAL_BASED_OUTPATIENT_CLINIC_OR_DEPARTMENT_OTHER): Payer: Self-pay | Admitting: Emergency Medicine

## 2018-02-26 DIAGNOSIS — N3001 Acute cystitis with hematuria: Secondary | ICD-10-CM | POA: Insufficient documentation

## 2018-02-26 DIAGNOSIS — N939 Abnormal uterine and vaginal bleeding, unspecified: Secondary | ICD-10-CM

## 2018-02-26 DIAGNOSIS — Z79899 Other long term (current) drug therapy: Secondary | ICD-10-CM | POA: Insufficient documentation

## 2018-02-26 LAB — CBC
HCT: 38.8 % (ref 36.0–46.0)
Hemoglobin: 11.7 g/dL — ABNORMAL LOW (ref 12.0–15.0)
MCH: 23.6 pg — AB (ref 26.0–34.0)
MCHC: 30.2 g/dL (ref 30.0–36.0)
MCV: 78.2 fL — AB (ref 80.0–100.0)
PLATELETS: 241 10*3/uL (ref 150–400)
RBC: 4.96 MIL/uL (ref 3.87–5.11)
RDW: 15.4 % (ref 11.5–15.5)
WBC: 6.8 10*3/uL (ref 4.0–10.5)
nRBC: 0 % (ref 0.0–0.2)

## 2018-02-26 LAB — URINALYSIS, MICROSCOPIC (REFLEX): RBC / HPF: >50 RBC/hpf (ref 0–5)

## 2018-02-26 LAB — URINALYSIS, ROUTINE W REFLEX MICROSCOPIC: LEUKOCYTES UA: NEGATIVE

## 2018-02-26 LAB — WET PREP, GENITAL
CLUE CELLS WET PREP: NONE SEEN
Sperm: NONE SEEN
TRICH WET PREP: NONE SEEN
Yeast Wet Prep HPF POC: NONE SEEN

## 2018-02-26 LAB — PREGNANCY, URINE: Preg Test, Ur: NEGATIVE

## 2018-02-26 MED ORDER — MEDROXYPROGESTERONE ACETATE 10 MG PO TABS: 10.0000 mg | ORAL_TABLET | Freq: Every day | ORAL | 0 refills | Status: DC

## 2018-02-26 MED ORDER — CEPHALEXIN 500 MG PO CAPS: 500.0000 mg | ORAL_CAPSULE | Freq: Two times a day (BID) | ORAL | 0 refills | Status: DC

## 2018-02-26 NOTE — ED Triage Notes (Signed)
Pt c/o on Monday with spotting. Thursday she spotted again, yesterday the bleeding increased. Pt reports this morning she went to work wearing to layers of pants that she bled through.

## 2018-02-26 NOTE — ED Provider Notes (Signed)
[facility] EMERGENCY DEPARTMENT Provider Note   CSN: [id number] Arrival date & time: 02/26/18  1340     History   Chief Complaint Chief Complaint  Patient presents with  . Back Pain  . Vaginal Bleeding  . Pelvic Pain    HPI Tracey Dunn is a 22 y.o. female history of menorrhagia, anemia, and morbid obesity who presents to the emergency department with a chief complaint of vaginal bleeding.  Patient reports light vaginal spotting 6 days ago that lasted for 1 day before stopping. The spotting started again 3 days ago and stopped after one day.  She reports last night that she began having heavy vaginal bleeding that has persisted since onset.  States she has passed 1 or 2 clots since onset.  Reports she was using regular pads and has had to change her pad approximately 5 times since onset.  States that she had to leave work today after she bled through her leggings and was concerned because she has never bled through her clothes.  She reports that 2 weeks ago she had some nonbloody, nonbilious emesis and nausea, which has persisted on and off for the last 2 weeks.  Last episode of emesis was 6 days ago.  She was concerned she might be pregnant so she took a home pregnancy test, which was negative.  She also reports she began having bilateral low back pain and bilateral pelvic pain 6 days ago that was characterized as sharp and severe at onset, but has remained constant and gradually improved in severity.  No known aggravating or alleviating factors.  She denies fever, chills, diarrhea, constipation, vaginal discharge vaginal itching, rashes, dysuria, urinary frequency or hesitancy, hematuria, increased flatulence, or belching.  She thinks that her last LMP was mid to late September.  She states that her periods typically occur monthly, but may last anywhere from 3-7 days.  She is sexually active with one female partner.  She does not use birth control.  Reports they intermittently  use condoms.  The history is provided by the patient. No language interpreter was used.    Past Medical History:  Diagnosis Date  . Anemia     Patient Active Problem List   Diagnosis Date Noted  . Anemia 04/01/2015  . Menorrhagia 04/01/2015  . Obesity, morbid, BMI 40.0-49.9 (HCC) 04/01/2015    Past Surgical History:  Procedure Laterality Date  . TONSILLECTOMY    . WISDOM TOOTH EXTRACTION       OB History    Gravida  0   Para  0   Term  0   Preterm  0   AB  0   Living  0     SAB  0   TAB  0   Ectopic  0   Multiple  0   Live Births               Home Medications    Prior to Admission medications   Medication Sig Start Date End Date Taking? Authorizing Provider  cephALEXin (KEFLEX) 500 MG capsule Take 1 capsule (500 mg total) by mouth 2 (two) times daily. 02/26/18   Baptiste Littler A, PA-C  ferrous sulfate 325 (65 FE) MG tablet Take 325 mg by mouth daily with breakfast.    [provider]  medroxyPROGESTERone (PROVERA) 10 MG tablet Take 1 tablet (10 mg total) by mouth daily. 02/26/18   Jobanny Mavis, Coral Else, PA-C    Family History Family History  Problem Relation Age of  Onset  . Hypertension Mother   . Diabetes Mother        Type 1  . Hypertension Maternal Grandmother   . Cancer Neg Hx   . Stroke Neg Hx     Social History Social History   Tobacco Use  . Smoking status: Never Smoker  . Smokeless tobacco: Never Used  Substance Use Topics  . Alcohol use: Yes    Comment: occ  . Drug use: No     Allergies   Sulfa antibiotics   Review of Systems Review of Systems  Constitutional: Negative for activity change, chills and fever.  Respiratory: Negative for shortness of breath.   Cardiovascular: Negative for chest pain.  Gastrointestinal: Positive for abdominal pain, nausea and vomiting. Negative for constipation, diarrhea and rectal pain.  Genitourinary: Positive for pelvic pain and vaginal bleeding. Negative for difficulty urinating,  dysuria, flank pain, frequency, hematuria and vaginal pain.  Musculoskeletal: Negative for back pain.  Skin: Negative for rash.  Allergic/Immunologic: Negative for immunocompromised state.  Neurological: Negative for weakness, numbness and headaches.  Psychiatric/Behavioral: Negative for confusion.     Physical Exam Updated Vital Signs BP 134/87 (BP Location: Left Arm)   Pulse 81   Temp 98.7 F (37.1 C) (Oral)   Resp 18   Ht 5\' 4"  (1.626 m)   Wt 99.8 kg   LMP 01/03/2018   SpO2 100%   BMI 37.76 kg/m   Physical Exam  Constitutional: No distress.  HENT:  Head: Normocephalic.  Eyes: Conjunctivae are normal.  Neck: Neck supple.  Cardiovascular: Normal rate, regular rhythm, normal heart sounds and intact distal pulses. Exam reveals no gallop and no friction rub.  No murmur heard. Pulmonary/Chest: Effort normal. No stridor. No respiratory distress. She has no wheezes. She has no rales. She exhibits no tenderness.  Abdominal: Soft. She exhibits no distension and no mass. There is tenderness. There is no rebound and no guarding. No hernia.  Mild tenderness palpation in the bilateral lower quadrants without rebound or guarding.  Abdomen is soft, nondistended.  No CVA tenderness bilaterally.  Upper abdomen is unremarkable.  No tenderness over McBurney's point.  Genitourinary:  Genitourinary Comments: Chaperoned exam.  A small amount of dark red blood is noted in the vaginal vault.  Lacerations or wounds to the vaginal wall.  No cervical motion tenderness.  No adnexal tenderness or masses bilaterally.  No obvious vaginal discharge.  Neurological: She is alert.  Skin: Skin is warm. No rash noted.  Psychiatric: Her behavior is normal.  Nursing note and vitals reviewed.    ED Treatments / Results  Labs (all labs ordered are listed, but only abnormal results are displayed) Labs Reviewed  WET PREP, GENITAL - Abnormal; Notable for the following components:      Result Value   WBC, Wet  Prep HPF POC MODERATE (*)    All other components within normal limits  URINALYSIS, ROUTINE W REFLEX MICROSCOPIC - Abnormal; Notable for the following components:   Color, Urine RED (*)    APPearance TURBID (*)    Glucose, UA   (*)    Value: TEST NOT REPORTED DUE TO COLOR INTERFERENCE OF URINE PIGMENT   Hgb urine dipstick   (*)    Value: TEST NOT REPORTED DUE TO COLOR INTERFERENCE OF URINE PIGMENT   Bilirubin Urine   (*)    Value: TEST NOT REPORTED DUE TO COLOR INTERFERENCE OF URINE PIGMENT   Ketones, ur   (*)    Value: TEST NOT REPORTED  DUE TO COLOR INTERFERENCE OF URINE PIGMENT   Protein, ur   (*)    Value: TEST NOT REPORTED DUE TO COLOR INTERFERENCE OF URINE PIGMENT   Nitrite   (*)    Value: TEST NOT REPORTED DUE TO COLOR INTERFERENCE OF URINE PIGMENT   All other components within normal limits  CBC - Abnormal; Notable for the following components:   Hemoglobin 11.7 (*)    MCV 78.2 (*)    MCH 23.6 (*)    All other components within normal limits  URINALYSIS, MICROSCOPIC (REFLEX) - Abnormal; Notable for the following components:   Bacteria, UA MANY (*)    All other components within normal limits  URINE CULTURE  PREGNANCY, URINE  GC/CHLAMYDIA PROBE AMP ([place]) NOT AT [facility]    EKG None  Radiology No results found.  Procedures Procedures (including critical care time)  Medications Ordered in ED Medications - No data to display   Initial Impression / Assessment and Plan / ED Course  I have reviewed the triage vital signs and the nursing notes.  Pertinent labs & imaging results that were available during my care of the patient were reviewed by me and considered in my medical decision making (see chart for details).     22 year old female with no pertinent past medical history presenting with vaginal bleeding that worsened last night with lower abdominal pain and back pain for the last few days.  She is hemodynamically stable and in no acute distress.  Pregnancy  test is negative.  Hemoglobin is 11.7, which appears to be close to her baseline.  On physical exam, she has no cervical motion tenderness or adnexal tenderness or masses bilaterally.  There is a small amount of blood in the vaginal vault, but no clots are noted.  Urinalysis is concerning for infection.  Urine culture sent.  Wet prep with moderate WBCs, but otherwise negative.  GC and gonorrhea are pending.  We will treat the patient with Keflex for UTI.  Low suspicion for positive gonorrhea or chlamydia based on exam and history and will defer treatment at this time.  We also participated in shared decision making conversation regarding her vaginal bleeding and she preferred to try a 10-day course of Provera since bleeding has been heavier over the last 24 hours and she was bleeding through her clothes.  She has not established with an OB/GYN so I will provide her with a referral for follow-up.  Doubt ectopic pregnancy, abortion, PID, pyelonephritis, ovarian torsion, diverticulitis, or appendicitis.  Strict return precautions given.  She is hemodynamically stable and in no acute distress.  She is safe for discharge home with outpatient follow-up at this time.  Final Clinical Impressions(s) / ED Diagnoses   Final diagnoses:  Acute cystitis with hematuria  Vaginal bleeding    ED Discharge Orders         Ordered    medroxyPROGESTERone (PROVERA) 10 MG tablet  Daily     02/26/18 1724    cephALEXin (KEFLEX) 500 MG capsule  2 times daily     02/26/18 1724           Ebert Forrester A, PA-C 02/26/18 1755    Linwood Dibbles, MD 02/27/18 1419

## 2018-02-26 NOTE — Discharge Instructions (Signed)
Thank you for allowing me to care for you today in the Emergency Department.   Your urine was concerning for infection today. Take 1 tablet of Keflex 2 times daily for the next week.  For the vaginal bleeding, you can take 1 tablet of Provera daily for the next 10 days.  Take 600 mg of ibuprofen with food and 600 mg of Tylenol every 6 hours for pain control.  Call to schedule follow-up appointment with OB/GYN regarding your irregular periods over the last 2 months.  Return to the emergency department if you develop significantly worsening symptoms, including chest pain, shortness of breath, significantly worsening vaginal bleeding, high fevers, persistent vomiting, or other new, concerning symptoms.

## 2018-02-28 LAB — URINE CULTURE

## 2018-02-28 LAB — GC/CHLAMYDIA PROBE AMP (~~LOC~~) NOT AT ARMC
CHLAMYDIA, DNA PROBE: NEGATIVE
NEISSERIA GONORRHEA: NEGATIVE

## 2018-06-28 ENCOUNTER — Encounter (HOSPITAL_BASED_OUTPATIENT_CLINIC_OR_DEPARTMENT_OTHER): Payer: Self-pay | Admitting: *Deleted

## 2018-06-28 ENCOUNTER — Emergency Department (HOSPITAL_BASED_OUTPATIENT_CLINIC_OR_DEPARTMENT_OTHER)
Admission: EM | Admit: 2018-06-28 | Discharge: 2018-06-28 | Disposition: A | Discharge status: Home / Self Care | Payer: Medicaid Other | Attending: Emergency Medicine | Admitting: Emergency Medicine

## 2018-06-28 ENCOUNTER — Other Ambulatory Visit: Payer: Self-pay

## 2018-06-28 DIAGNOSIS — J020 Streptococcal pharyngitis: Secondary | ICD-10-CM | POA: Insufficient documentation

## 2018-06-28 DIAGNOSIS — Z79899 Other long term (current) drug therapy: Secondary | ICD-10-CM | POA: Insufficient documentation

## 2018-06-28 LAB — GROUP A STREP BY PCR: Group A Strep by PCR: DETECTED — AB

## 2018-06-28 MED ORDER — ACETAMINOPHEN 325 MG PO TABS
650.0000 mg | ORAL_TABLET | Freq: Once | ORAL | Status: AC
  Administered 2018-06-28: 650 mg via ORAL
  Filled 2018-06-28: qty 2

## 2018-06-28 MED ORDER — DEXAMETHASONE SODIUM PHOSPHATE 10 MG/ML IJ SOLN
10.0000 mg | Freq: Once | INTRAMUSCULAR | Status: DC
  Filled 2018-06-28: qty 1

## 2018-06-28 MED ORDER — DEXAMETHASONE SODIUM PHOSPHATE 10 MG/ML IJ SOLN
10.0000 mg | Freq: Once | INTRAMUSCULAR | Status: AC
  Administered 2018-06-28: 10 mg via INTRAMUSCULAR

## 2018-06-28 MED ORDER — PENICILLIN G BENZATHINE 1200000 UNIT/2ML IM SUSP
1.2000 10*6.[IU] | Freq: Once | INTRAMUSCULAR | Status: AC
  Administered 2018-06-28: 1.2 10*6.[IU] via INTRAMUSCULAR
  Filled 2018-06-28: qty 2

## 2018-06-28 NOTE — ED Triage Notes (Signed)
Sore throat x 3 days. She vomited this am. Dizziness since last night. She is ambulatory. She drove herself here.

## 2018-06-28 NOTE — ED Provider Notes (Signed)
[facility] EMERGENCY DEPARTMENT Provider Note   CSN: [id number] Arrival date & time: 06/28/18  1210    History   Chief Complaint Chief Complaint  Patient presents with  . Sore Throat    HPI Tracey Dunn is a 23 y.o. female with history of anemia presenting to emergency department today with chief complaint of sore throat x3 days.  Patient describes her throat as feeling raw.  The pain is worse with swallowing.  She rates the pain as 7 out of 10 in severity.  Patient reports associated nausea and vomiting.  She had one episode of nonbloody nonbilious emesis prior to arrival.  Patient also states she felt lightheaded last night and that it spontaneously resolved. Patient has not taken medications for her symptoms prior to arrival. Denies cough, shortness of breath, chest pain, abdominal pain, diarrhea, urinary symptoms.  History provided by patient.     Past Medical History:  Diagnosis Date  . Anemia     Patient Active Problem List   Diagnosis Date Noted  . Anemia 04/01/2015  . Menorrhagia 04/01/2015  . Obesity, morbid, BMI 40.0-49.9 (HCC) 04/01/2015    Past Surgical History:  Procedure Laterality Date  . TONSILLECTOMY    . WISDOM TOOTH EXTRACTION       OB History    Gravida  0   Para  0   Term  0   Preterm  0   AB  0   Living  0     SAB  0   TAB  0   Ectopic  0   Multiple  0   Live Births               Home Medications    Prior to Admission medications   Medication Sig Start Date End Date Taking? Authorizing Provider  ferrous sulfate 325 (65 FE) MG tablet Take 325 mg by mouth daily with breakfast.   Yes [provider]  cephALEXin (KEFLEX) 500 MG capsule Take 1 capsule (500 mg total) by mouth 2 (two) times daily. 02/26/18   McDonald, Mia A, PA-C  medroxyPROGESTERone (PROVERA) 10 MG tablet Take 1 tablet (10 mg total) by mouth daily. 02/26/18   McDonald, Mia A, PA-C    Family History Family History  Problem Relation Age  of Onset  . Hypertension Mother   . Diabetes Mother        Type 1  . Hypertension Maternal Grandmother   . Cancer Neg Hx   . Stroke Neg Hx     Social History Social History   Tobacco Use  . Smoking status: Never Smoker  . Smokeless tobacco: Never Used  Substance Use Topics  . Alcohol use: Yes    Comment: occ  . Drug use: No     Allergies   Sulfa antibiotics   Review of Systems Review of Systems  Constitutional: Negative for chills and fever.  HENT: Positive for sore throat. Negative for congestion, drooling, ear pain, sinus pain and trouble swallowing.   Eyes: Negative for discharge and itching.  Gastrointestinal: Positive for nausea and vomiting.  Neurological: Negative for weakness, light-headedness and headaches.  All other systems reviewed and are negative.    Physical Exam Updated Vital Signs BP 136/75   Pulse 86   Temp 98.2 F (36.8 C) (Oral)   Resp 18   Ht  (1.626 m)   Wt 118.9 kg   SpO2 99%   BMI 44.98 kg/m   Physical Exam Vitals signs and  nursing note reviewed.  Constitutional:      Appearance: She is well-developed. She is not toxic-appearing.  HENT:     Head: Normocephalic and atraumatic.     Right Ear: Tympanic membrane and external ear normal.     Left Ear: Tympanic membrane and external ear normal.     Nose: Nose normal.     Mouth/Throat:     Pharynx: Uvula midline.     Comments: Erythema to oropharynx, no edema, no exudate, no tonsillar swelling, voice normal, neck supple without lymphadenopathy  Eyes:     General: No scleral icterus.       Right eye: No discharge.        Left eye: No discharge.     Conjunctiva/sclera: Conjunctivae normal.  Neck:     Musculoskeletal: Normal range of motion. No muscular tenderness.  Cardiovascular:     Rate and Rhythm: Normal rate and regular rhythm.     Pulses: Normal pulses.     Heart sounds: Normal heart sounds.  Pulmonary:     Effort: Pulmonary effort is normal.     Breath sounds:  Normal breath sounds.  Abdominal:     General: There is no distension.     Palpations: Abdomen is soft.     Tenderness: There is no abdominal tenderness. There is no guarding or rebound.  Musculoskeletal: Normal range of motion.  Lymphadenopathy:     Cervical: No cervical adenopathy.  Skin:    General: Skin is warm and dry.  Neurological:     Mental Status: She is oriented to person, place, and time.     Comments: Fluent speech, no facial droop.  Psychiatric:        Behavior: Behavior normal.      ED Treatments / Results  Labs (all labs ordered are listed, but only abnormal results are displayed) Labs Reviewed  GROUP A STREP BY PCR - Abnormal; Notable for the following components:      Result Value   Group A Strep by PCR DETECTED (*)    All other components within normal limits    EKG None  Radiology No results found.  Procedures Procedures (including critical care time)  Medications Ordered in ED Medications  acetaminophen (TYLENOL) tablet 650 mg (650 mg Oral Given 06/28/18 1342)  penicillin g benzathine (BICILLIN LA) 1200000 UNIT/2ML injection 1.2 Million Units (1.2 Million Units Intramuscular Given 06/28/18 1343)  dexamethasone (DECADRON) injection 10 mg (10 mg Intramuscular Given 06/28/18 1342)     Initial Impression / Assessment and Plan / ED Course  I have reviewed the triage vital signs and the nursing notes.  Pertinent labs & imaging results that were available during my care of the patient were reviewed by me and considered in my medical decision making (see chart for details).    23 y.o. female patient with 3 days of sore throat. Rapid strep positive. Pain is not out of proportion to exam. No asymmetry of the uvula, tonsils, or soft palate. No stridor, trismus, or "hot potato" voice. The patient appears non-toxic. NAD. Treated in the ED with PCN IM. Presentation not concerning for PTA or Ludwig's angina, Uvulitis, epiglottitis, peritonsillar abscess, or  retropharyngeal abscess. Specific return precautions discussed. Patient successfully fluid challenged in the ED without difficulty swallowing.  NAD. VSS.   Patient is hemodynamically stable, in NAD. Evaluation does not show pathology that would require ongoing emergent intervention or inpatient treatment. I explained the diagnosis to the patient. Patient is comfortable with above plan and is  stable for discharge at this time. All questions were answered prior to disposition. Strict return precautions for returning to the ED were discussed. Encouraged follow up with PCP follow up for re-evaluation.  This note was prepared with assistance of Conservation officer, historic buildings. Occasional wrong-word or sound-a-like substitutions may have occurred due to the inherent limitations of voice recognition software.     Final Clinical Impressions(s) / ED Diagnoses   Final diagnoses:  Strep pharyngitis    ED Discharge Orders    None       Kathyrn Lass 06/28/18 1405    Terrilee Files, MD 06/28/18 Rickey Primus

## 2018-06-28 NOTE — Discharge Instructions (Addendum)

## 2018-06-30 ENCOUNTER — Other Ambulatory Visit: Payer: Self-pay

## 2018-06-30 ENCOUNTER — Encounter (HOSPITAL_BASED_OUTPATIENT_CLINIC_OR_DEPARTMENT_OTHER): Payer: Self-pay

## 2018-06-30 ENCOUNTER — Emergency Department (HOSPITAL_BASED_OUTPATIENT_CLINIC_OR_DEPARTMENT_OTHER)
Admission: EM | Admit: 2018-06-30 | Discharge: 2018-07-01 | Disposition: A | Discharge status: Home / Self Care | Payer: Self-pay | Attending: Emergency Medicine | Admitting: Emergency Medicine

## 2018-06-30 ENCOUNTER — Emergency Department (HOSPITAL_BASED_OUTPATIENT_CLINIC_OR_DEPARTMENT_OTHER): Payer: Self-pay

## 2018-06-30 DIAGNOSIS — B9789 Other viral agents as the cause of diseases classified elsewhere: Secondary | ICD-10-CM

## 2018-06-30 DIAGNOSIS — J069 Acute upper respiratory infection, unspecified: Secondary | ICD-10-CM | POA: Insufficient documentation

## 2018-06-30 DIAGNOSIS — Z79899 Other long term (current) drug therapy: Secondary | ICD-10-CM | POA: Insufficient documentation

## 2018-06-30 NOTE — ED Triage Notes (Signed)
Pt c/o cough started last night-recent strep dx with treatment-NAD-steady gait

## 2018-06-30 NOTE — Discharge Instructions (Signed)
Evaluated today for cough.  Please follow-up with PCP for evaluation of continued of symptoms.  Is likely viral in nature.

## 2018-06-30 NOTE — ED Provider Notes (Signed)
[facility] EMERGENCY DEPARTMENT Provider Note   CSN: [id number] Arrival date & time: 06/30/18  2037    History   Chief Complaint Chief Complaint  Patient presents with  . Cough    HPI Tracey Dunn is a 23 y.o. female with no significant past medical history who presents for evaluation of cough.  Patient diagnosed and treated for strep 2 days ago.  Patient states she developed a cough x1 day.  Cough productive of green and yellow sputum.  Patient states she is also had congestion, rhinorrhea as well as itchy watery eyes. Denies  fever, chills, nausea, vomiting, chest pain, shortness of breath, abdominal pain, diarrhea, dysuria, hemoptysis, lower extremity swelling.Marland Kitchen  Has not take anything for symptoms PTA.  Has had multiple sick contacts.  Unsure if she received influenza vaccine.  Denies additional aggravating or alleviating factors.  History obtained from patient.  No interpreter was used.     HPI  Past Medical History:  Diagnosis Date  . Anemia     Patient Active Problem List   Diagnosis Date Noted  . Anemia 04/01/2015  . Menorrhagia 04/01/2015  . Obesity, morbid, BMI 40.0-49.9 (HCC) 04/01/2015    Past Surgical History:  Procedure Laterality Date  . TONSILLECTOMY    . WISDOM TOOTH EXTRACTION       OB History    Gravida  0   Para  0   Term  0   Preterm  0   AB  0   Living  0     SAB  0   TAB  0   Ectopic  0   Multiple  0   Live Births               Home Medications    Prior to Admission medications   Medication Sig Start Date End Date Taking? Authorizing Provider  cephALEXin (KEFLEX) 500 MG capsule Take 1 capsule (500 mg total) by mouth 2 (two) times daily. 02/26/18   McDonald, Mia A, PA-C  ferrous sulfate 325 (65 FE) MG tablet Take 325 mg by mouth daily with breakfast.    [provider]  fluticasone (FLONASE) 50 MCG/ACT nasal spray Place 1 spray into both nostrils daily. 07/01/18   Tenesha Garza A, PA-C   guaifenesin (ROBITUSSIN) 100 MG/5ML syrup Take 5-10 mLs (100-200 mg total) by mouth every 4 (four) hours as needed for cough. 07/01/18   Tracey Stewart A, PA-C  medroxyPROGESTERone (PROVERA) 10 MG tablet Take 1 tablet (10 mg total) by mouth daily. 02/26/18   McDonald, Mia A, PA-C    Family History Family History  Problem Relation Age of Onset  . Hypertension Mother   . Diabetes Mother        Type 1  . Hypertension Maternal Grandmother   . Cancer Neg Hx   . Stroke Neg Hx     Social History Social History   Tobacco Use  . Smoking status: Never Smoker  . Smokeless tobacco: Never Used  Substance Use Topics  . Alcohol use: Yes    Comment: occ  . Drug use: No     Allergies   Sulfa antibiotics   Review of Systems Review of Systems  Constitutional: Negative.   HENT: Positive for congestion, postnasal drip, rhinorrhea and sore throat. Negative for ear discharge, ear pain, sinus pressure, sinus pain, sneezing, tinnitus, trouble swallowing and voice change.   Eyes: Positive for itching.  Respiratory: Positive for cough. Negative for apnea, choking, chest tightness, shortness of breath,  wheezing and stridor.   Gastrointestinal: Negative.   Genitourinary: Negative.   Musculoskeletal: Negative.   Skin: Negative.   Neurological: Negative.   All other systems reviewed and are negative.    Physical Exam Updated Vital Signs BP 130/88 (BP Location: Right Arm)   Pulse 80   Temp 98.2 F (36.8 C) (Oral)   Resp 18   Ht 5\' 4"  (1.626 m)   Wt 117.9 kg   LMP 05/23/2018   SpO2 100%   BMI 44.63 kg/m   Physical Exam Vitals signs and nursing note reviewed.  Constitutional:      General: She is not in acute distress.    Appearance: She is well-developed. She is not ill-appearing or toxic-appearing.     Comments: Patient sitting in bed texting on phone drinking on initial evaluation.  No acute distress noted.  HENT:     Head: Normocephalic and atraumatic.     Right Ear: Tympanic  membrane, ear canal and external ear normal. There is no impacted cerumen.     Left Ear: Tympanic membrane, ear canal and external ear normal. There is no impacted cerumen.     Nose:     Comments: Clear rhinorrhea and congestion to bilateral nares.    Mouth/Throat:     Comments: Posterior oropharynx clear.  Mucous membranes moist.  Tonsils without edema or exudate.  Uvula midline without deviation.  No evidence of PTA or RPA.  No drooling, dysphasia or trismus.  Phonation normal Eyes:     Pupils: Pupils are equal, round, and reactive to light.     Comments: No purulent eye drainage.  Neck:     Musculoskeletal: Normal range of motion.     Comments: No neck Stiffness or neck rigidity, no meningismus. Cardiovascular:     Rate and Rhythm: Normal rate.     Pulses: Normal pulses.     Heart sounds: Normal heart sounds.  Pulmonary:     Effort: No respiratory distress.     Comments: Clear to ascultation bilaterally without wheeze, rhonchi or rales.  No accessory muscle usage.  Able speak in full sentences without difficulty. Abdominal:     General: There is no distension.     Comments: Soft Nontender without rebound or guarding.  Musculoskeletal: Normal range of motion.     Comments: Moves All 4 extremities without difficulty.  Ambulatory in department without difficulty  Skin:    General: Skin is warm and dry.     Comments: No rashes or lesions.  Neurological:     Mental Status: She is alert.      ED Treatments / Results  Labs (all labs ordered are listed, but only abnormal results are displayed) Labs Reviewed - No data to display  EKG None  Radiology Dg Chest 2 View  Result Date: 06/30/2018 CLINICAL DATA:  Initial evaluation for acute cough, fever. EXAM: CHEST - 2 VIEW COMPARISON:  Prior radiograph from 07/11/2016. FINDINGS: The cardiac and mediastinal silhouettes are stable in size and contour, and remain within normal limits. The lungs are normally inflated. No airspace  consolidation, pleural effusion, or pulmonary edema is identified. There is no pneumothorax. No acute osseous abnormality identified. IMPRESSION: No radiographic evidence for active cardiopulmonary disease. Electronically Signed   By: Rise Mu M.D.   On: 06/30/2018 23:13    Procedures Procedures (including critical care time)  Medications Ordered in ED Medications - No data to display   Initial Impression / Assessment and Plan / ED Course  I have reviewed  the triage vital signs and the nursing notes.  Pertinent labs & imaging results that were available during my care of the patient were reviewed by me and considered in my medical decision making (see chart for details).  23 year old female who appears otherwise well presents for evaluation of upper respiratory symptoms.  Afebrile, nonseptic, non-ill-appearing.  Patient with productive cough.  Lungs clear to auscultation bilaterally without wheeze, rhonchi or rales.  Chest x-ray negative for infiltrates.  No hypoxia, tachycardia or tachypnea.  No neck stiffness or neck rigidity, low suspicion for meningitis.  Ears without evidence of otitis.  Eyes without evidence of purulent drainage, low suspicion for conjunctivitis.  Posterior oropharynx clear.  Mucous membranes moist.  Tonsils without erythema or exudate.  Uvula midline without deviation.  No drooling, dysphasia or trismus.  Able to tolerate p.o. intake in department without difficulty.  No evidence of PTA or RPA.  Patient likely with flulike illness.  She is outside treatment window for Tamiflu.  Discussed symptomatic management.  Patient hemodynamically stable and appropriate for DC at this time.  I discussed return precautions.  Patient voiced understanding and is agreeable for follow-up.     Final Clinical Impressions(s) / ED Diagnoses   Final diagnoses:  Viral URI with cough    ED Discharge Orders         Ordered    fluticasone (FLONASE) 50 MCG/ACT nasal spray  Daily      07/01/18 0001    guaifenesin (ROBITUSSIN) 100 MG/5ML syrup  Every 4 hours PRN     07/01/18 0001    fluticasone (FLONASE) 50 MCG/ACT nasal spray  Daily,   Status:  Discontinued     07/01/18 0000    guaifenesin (ROBITUSSIN) 100 MG/5ML syrup  Every 4 hours PRN,   Status:  Discontinued     07/01/18 0000           Jamont Mellin A, PA-C 07/01/18 0053    Arby Barrette, MD 07/02/18 1131

## 2018-07-01 MED ORDER — GUAIFENESIN 100 MG/5ML PO SYRP: 100.0000 mg | ORAL_SOLUTION | ORAL | 0 refills | Status: DC | PRN

## 2018-07-01 MED ORDER — FLUTICASONE PROPIONATE 50 MCG/ACT NA SUSP: 1.0000 | Freq: Every day | NASAL | 2 refills | Status: DC

## 2019-01-06 ENCOUNTER — Other Ambulatory Visit: Payer: Self-pay

## 2019-01-06 ENCOUNTER — Encounter: Payer: Self-pay | Admitting: Obstetrics & Gynecology

## 2019-01-06 ENCOUNTER — Ambulatory Visit (INDEPENDENT_AMBULATORY_CARE_PROVIDER_SITE_OTHER): Payer: Medicaid Other | Admitting: Obstetrics & Gynecology

## 2019-01-06 ENCOUNTER — Other Ambulatory Visit (HOSPITAL_COMMUNITY)
Admission: RE | Admit: 2019-01-06 | Discharge: 2019-01-06 | Disposition: A | Discharge status: Home / Self Care | Payer: Medicaid Other | Source: Ambulatory Visit | Attending: Obstetrics & Gynecology | Admitting: Obstetrics & Gynecology

## 2019-01-06 DIAGNOSIS — Z3400 Encounter for supervision of normal first pregnancy, unspecified trimester: Secondary | ICD-10-CM

## 2019-01-06 DIAGNOSIS — Z3A16 16 weeks gestation of pregnancy: Secondary | ICD-10-CM | POA: Diagnosis not present

## 2019-01-06 DIAGNOSIS — Z3402 Encounter for supervision of normal first pregnancy, second trimester: Secondary | ICD-10-CM | POA: Diagnosis not present

## 2019-01-06 DIAGNOSIS — O99210 Obesity complicating pregnancy, unspecified trimester: Secondary | ICD-10-CM | POA: Insufficient documentation

## 2019-01-06 LAB — POCT URINALYSIS DIPSTICK OB
Glucose, UA: NEGATIVE
Nitrite, UA: NEGATIVE
Spec Grav, UA: >=1.03 — AB (ref 1.010–1.025)
pH, UA: 6 (ref 5.0–8.0)

## 2019-01-06 MED ORDER — AMBULATORY NON FORMULARY MEDICATION: 1.0000 | 0 refills | Status: DC

## 2019-01-06 NOTE — Progress Notes (Signed)
  Subjective:    Tracey Dunn is being seen today for her first obstetrical visit. G1P0LMP Sep 16, 2018.    This is not a planned pregnancy. She is at [redacted]w[redacted]d gestation. Her obstetrical history is significant for obesity. Relationship with FOB: significant other. . Patient does intend to breast feed. Pregnancy history fully reviewed.  Patient reports no complaints. She thinks that she is feeling movement.   Review of Systems:   Review of Systems  Objective:     BP 125/60   Pulse (!) 108   Wt 253 lb 1.3 oz (114.8 kg)   LMP 09/16/2018 (Exact Date)   BMI 43.44 kg/m  Physical Exam  Exam General Appearance:    Alert, cooperative, no distress, appears stated age  Head:    Normocephalic, without obvious abnormality, atraumatic  Eyes:    conjunctiva/corneas clear, EOM's intact, both eyes  Ears:    Normal external ear canals, both ears  Nose:   Nares normal, septum midline, mucosa normal, no drainage    or sinus tenderness  Throat:   Lips, mucosa, and tongue normal; teeth and gums normal  Neck:   Supple, symmetrical, trachea midline, no adenopathy;    thyroid:  no enlargement/tenderness/nodules  Back:     Symmetric, no curvature, ROM normal, no CVA tenderness  Lungs:     respirations unlabored  Chest Wall:    No tenderness or deformity   Heart:    Regular rate and rhythm  Breast Exam:    No tenderness, masses, or nipple abnormality  Abdomen:     Soft, non-tender, bowel sounds active all four quadrants,    no masses, no organomegaly  Genitalia:    Normal female without lesion, discharge or tenderness; mobile 16 weeks sized uterus.      Extremities:   Extremities normal, atraumatic, no cyanosis or edema  Pulses:   2+ and symmetric all extremities  Skin:   Skin color, texture, turgor normal, no rashes or lesions     Assessment:    Pregnancy: G1P0000 Patient Active Problem List   Diagnosis Date Noted  . Supervision of normal first pregnancy, antepartum 01/06/2019  . Obesity  affecting pregnancy, antepartum 01/06/2019  . Anemia 04/01/2015  . Menorrhagia 04/01/2015  . Obesity, morbid, BMI 40.0-49.9 ([place]) 04/01/2015       Plan:     Initial labs drawn. Prenatal vitamins. Problem list reviewed and updated. AFP3 discussed: requested. Role of ultrasound in pregnancy discussed; fetal survey: requested. Amniocentesis discussed: not indicated. Follow up in 4 weeks. 50% of 30 min visit spent on counseling and coordination of care.  Anatomy scan ordered.  NIPs and AFP sent today   Lavonia Drafts 01/06/2019

## 2019-01-06 NOTE — Addendum Note (Signed)
Addended by: Valentina Lucks on: 01/06/2019 09:59 AM   Modules accepted: Orders

## 2019-01-06 NOTE — Patient Instructions (Signed)
Exercise During Pregnancy Exercise is an important part of being healthy for people of all ages. Exercise improves the function of your heart and lungs and helps you maintain strength, flexibility, and a healthy body weight. Exercise also boosts energy levels and elevates mood. Most women should exercise regularly during pregnancy. In rare cases, women with certain medical conditions or complications may be asked to limit or avoid exercise during pregnancy. How does this affect me? Along with maintaining general strength and flexibility, exercising during pregnancy can help:  Keep strength in muscles that are used during labor and childbirth.  Decrease low back pain.  Reduce symptoms of depression.  Control weight gain during pregnancy.  Reduce the risk of needing insulin if you develop diabetes during pregnancy.  Decrease the risk of cesarean delivery.  Speed up your recovery after giving birth. How does this affect my baby? Exercise can help you have a healthy pregnancy. Exercise does not cause premature birth. It will not cause your baby to weigh less at birth. What exercises can I do? Many exercises are safe for you to do during pregnancy. Do a variety of exercises that safely increase your heart and breathing rates and help you build and maintain muscle strength. Do exercises exactly as told by your health care provider. You may do these exercises:  Walking or hiking.  Swimming.  Water aerobics.  Riding a stationary bike.  Strength training.  Modified yoga or Pilates. Tell your instructor that you are pregnant. Avoid overstretching, and avoid lying on your back for long periods of time.  Running or jogging. Only choose this type of exercise if you: ? Ran or jogged regularly before your pregnancy. ? Can run or jog and still talk in complete sentences. What exercises should I avoid? Depending on your level of fitness and whether you exercised regularly before your  pregnancy, you may be told to limit high-intensity exercise. You can tell that you are exercising at a high intensity if you are breathing much harder and faster and cannot hold a conversation while exercising. You must avoid:  Contact sports.  Activities that put you at risk for falling on or being hit in the belly, such as downhill skiing, water skiing, surfing, rock climbing, cycling, gymnastics, and horseback riding.  Scuba diving.  Skydiving.  Yoga or Pilates in a room that is heated to high temperatures.  Jogging or running, unless you ran or jogged regularly before your pregnancy. While jogging or running, you should always be able to talk in full sentences. Do not run or jog so fast that you are unable to have a conversation.  Do not exercise at more than 6,000 feet above sea level (high elevation) if you are not used to exercising at high elevation. How do I exercise in a safe way?   Avoid overheating. Do not exercise in very high temperatures.  Wear loose-fitting, breathable clothes.  Avoid dehydration. Drink enough water before, during, and after exercise to keep your urine pale yellow.  Avoid overstretching. Because of hormone changes during pregnancy, it is easy to overstretch muscles, tendons, and ligaments during pregnancy.  Start slowly and ask your health care provider to recommend the types of exercise that are safe for you.  Do not exercise to lose weight. Follow these instructions at home:  Exercise on most days or all days of the week. Try to exercise for 30 minutes a day, 5 days a week, unless your health care provider tells you not to.  If  you actively exercised before your pregnancy and you are healthy, your health care provider may tell you to continue to do moderate to high-intensity exercise.  If you are just starting to exercise or did not exercise much before your pregnancy, your health care provider may tell you to do low to moderate-intensity  exercise. Questions to ask your health care provider  Is exercise safe for me?  What are signs that I should stop exercising?  Does my health condition mean that I should not exercise during pregnancy?  When should I avoid exercising during pregnancy? Stop exercising and contact a health care provider if: You have any unusual symptoms, such as:  Mild contractions of the uterus or cramps in the abdomen.  Dizziness that does not go away when you rest. Stop exercising and get help right away if: You have any unusual symptoms, such as:  Sudden, severe pain in your low back or your belly.  Mild contractions of the uterus or cramps in the abdomen that do not improve with rest and drinking fluids.  Chest pain.  Bleeding or fluid leaking from your vagina.  Shortness of breath. These symptoms may represent a serious problem that is an emergency. Do not wait to see if the symptoms will go away. Get medical help right away. Call your local emergency services (911 in the [place] Do not drive yourself to the hospital. Summary  Most women should exercise regularly throughout pregnancy. In rare cases, women with certain medical conditions or complications may be asked to limit or avoid exercise during pregnancy.  Do not exercise to lose weight during pregnancy.  Your health care provider will tell you what level of physical activity is right for you.  Stop exercising and contact a health care provider if you have mild contractions of the uterus or cramps in the abdomen. Get help right away if these contractions or cramps do not improve with rest and drinking fluids.  Stop exercising and get help right away if you have sudden, severe pain in your low back or belly, chest pain, shortness of breath, or bleeding or leaking of fluid from your vagina. This information is not intended to replace advice given to you by your health care provider. Make sure you discuss any questions you have with your  health care provider. Document Released: 04/06/2005 Document Revised: 07/28/2018 Document Reviewed: 05/11/2018 Elsevier Patient Education  2020 [employer] Second Trimester of Pregnancy The second trimester is from week 14 through week 27 (months 4 through 6). The second trimester is often a time when you feel your best. Your body has adjusted to being pregnant, and you begin to feel better physically. Usually, morning sickness has lessened or quit completely, you may have more energy, and you may have an increase in appetite. The second trimester is also a time when the fetus is growing rapidly. At the end of the sixth month, the fetus is about 9 inches long and weighs about 1 pounds. You will likely begin to feel the baby move (quickening) between 16 and 20 weeks of pregnancy. Body changes during your second trimester Your body continues to go through many changes during your second trimester. The changes vary from woman to woman.  Your weight will continue to increase. You will notice your lower abdomen bulging out.  You may begin to get stretch marks on your hips, abdomen, and breasts.  You may develop headaches that can be relieved by medicines. The medicines should be approved by your health  care provider.  You may urinate more often because the fetus is pressing on your bladder.  You may develop or continue to have heartburn as a result of your pregnancy.  You may develop constipation because certain hormones are causing the muscles that push waste through your intestines to slow down.  You may develop hemorrhoids or swollen, bulging veins (varicose veins).  You may have back pain. This is caused by: ? Weight gain. ? Pregnancy hormones that are relaxing the joints in your pelvis. ? A shift in weight and the muscles that support your balance.  Your breasts will continue to grow and they will continue to become tender.  Your gums may bleed and may be sensitive to brushing and  flossing.  Dark spots or blotches (chloasma, mask of pregnancy) may develop on your face. This will likely fade after the baby is born.  A dark line from your belly button to the pubic area (linea nigra) may appear. This will likely fade after the baby is born.  You may have changes in your hair. These can include thickening of your hair, rapid growth, and changes in texture. Some women also have hair loss during or after pregnancy, or hair that feels dry or thin. Your hair will most likely return to normal after your baby is born. What to expect at prenatal visits During a routine prenatal visit:  You will be weighed to make sure you and the fetus are growing normally.  Your blood pressure will be taken.  Your abdomen will be measured to track your baby's growth.  The fetal heartbeat will be listened to.  Any test results from the previous visit will be discussed. Your health care provider may ask you:  How you are feeling.  If you are feeling the baby move.  If you have had any abnormal symptoms, such as leaking fluid, bleeding, severe headaches, or abdominal cramping.  If you are using any tobacco products, including cigarettes, chewing tobacco, and electronic cigarettes.  If you have any questions. Other tests that may be performed during your second trimester include:  Blood tests that check for: ? Low iron levels (anemia). ? High blood sugar that affects pregnant women (gestational diabetes) between 5924 and 28 weeks. ? Rh antibodies. This is to check for a protein on red blood cells (Rh factor).  Urine tests to check for infections, diabetes, or protein in the urine.  An ultrasound to confirm the proper growth and development of the baby.  An amniocentesis to check for possible genetic problems.  Fetal screens for spina bifida and Down syndrome.  HIV (human immunodeficiency virus) testing. Routine prenatal testing includes screening for HIV, unless you choose not to  have this test. Follow these instructions at home: Medicines  Follow your health care provider's instructions regarding medicine use. Specific medicines may be either safe or unsafe to take during pregnancy.  Take a prenatal vitamin that contains at least 600 micrograms (mcg) of folic acid.  If you develop constipation, try taking a stool softener if your health care provider approves. Eating and drinking   Eat a balanced diet that includes fresh fruits and vegetables, whole grains, good sources of protein such as meat, eggs, or tofu, and low-fat dairy. Your health care provider will help you determine the amount of weight gain that is right for you.  Avoid raw meat and uncooked cheese. These carry germs that can cause birth defects in the baby.  If you have low calcium intake from  food, talk to your health care provider about whether you should take a daily calcium supplement.  Limit foods that are high in fat and processed sugars, such as fried and sweet foods.  To prevent constipation: ? Drink enough fluid to keep your urine clear or pale yellow. ? Eat foods that are high in fiber, such as fresh fruits and vegetables, whole grains, and beans. Activity  Exercise only as directed by your health care provider. Most women can continue their usual exercise routine during pregnancy. Try to exercise for 30 minutes at least 5 days a week. Stop exercising if you experience uterine contractions.  Avoid heavy lifting, wear low heel shoes, and practice good posture.  A sexual relationship may be continued unless your health care provider directs you otherwise. Relieving pain and discomfort  Wear a good support bra to prevent discomfort from breast tenderness.  Take warm sitz baths to soothe any pain or discomfort caused by hemorrhoids. Use hemorrhoid cream if your health care provider approves.  Rest with your legs elevated if you have leg cramps or low back pain.  If you develop  varicose veins, wear support hose. Elevate your feet for 15 minutes, 3-4 times a day. Limit salt in your diet. Prenatal Care  Write down your questions. Take them to your prenatal visits.  Keep all your prenatal visits as told by your health care provider. This is important. Safety  Wear your seat belt at all times when driving.  Make a list of emergency phone numbers, including numbers for family, friends, the hospital, and police and fire departments. General instructions  Ask your health care provider for a referral to a local prenatal education class. Begin classes no later than the beginning of month 6 of your pregnancy.  Ask for help if you have counseling or nutritional needs during pregnancy. Your health care provider can offer advice or refer you to specialists for help with various needs.  Do not use hot tubs, steam rooms, or saunas.  Do not douche or use tampons or scented sanitary pads.  Do not cross your legs for long periods of time.  Avoid cat litter boxes and soil used by cats. These carry germs that can cause birth defects in the baby and possibly loss of the fetus by miscarriage or stillbirth.  Avoid all smoking, herbs, alcohol, and unprescribed drugs. Chemicals in these products can affect the formation and growth of the baby.  Do not use any products that contain nicotine or tobacco, such as cigarettes and e-cigarettes. If you need help quitting, ask your health care provider.  Visit your dentist if you have not gone yet during your pregnancy. Use a soft toothbrush to brush your teeth and be gentle when you floss. Contact a health care provider if:  You have dizziness.  You have mild pelvic cramps, pelvic pressure, or nagging pain in the abdominal area.  You have persistent nausea, vomiting, or diarrhea.  You have a bad smelling vaginal discharge.  You have pain when you urinate. Get help right away if:  You have a fever.  You are leaking fluid from  your vagina.  You have spotting or bleeding from your vagina.  You have severe abdominal cramping or pain.  You have rapid weight gain or weight loss.  You have shortness of breath with chest pain.  You notice sudden or extreme swelling of your face, hands, ankles, feet, or legs.  You have not felt your baby move in over an hour.  You have severe headaches that do not go away when you take medicine.  You have vision changes. Summary  The second trimester is from week 14 through week 27 (months 4 through 6). It is also a time when the fetus is growing rapidly.  Your body goes through many changes during pregnancy. The changes vary from woman to woman.  Avoid all smoking, herbs, alcohol, and unprescribed drugs. These chemicals affect the formation and growth your baby.  Do not use any tobacco products, such as cigarettes, chewing tobacco, and e-cigarettes. If you need help quitting, ask your health care provider.  Contact your health care provider if you have any questions. Keep all prenatal visits as told by your health care provider. This is important. This information is not intended to replace advice given to you by your health care provider. Make sure you discuss any questions you have with your health care provider. Document Released: 03/31/2001 Document Revised: 07/29/2018 Document Reviewed: 05/12/2016 Elsevier Patient Education  2020 [employer].

## 2019-01-08 LAB — CULTURE, OB URINE

## 2019-01-08 LAB — URINE CULTURE, OB REFLEX: Organism ID, Bacteria: NO GROWTH

## 2019-01-11 LAB — AFP, SERUM, OPEN SPINA BIFIDA
AFP MoM: 1.28
AFP Value: 34.2 ng/mL
Gest. Age on Collection Date: 16 weeks
Maternal Age At EDD: 22.5 yr
OSBR Risk 1 IN: 10000
Test Results:: NEGATIVE
Weight: 254 [lb_av]

## 2019-01-11 LAB — HEMOGLOBINOPATHY EVALUATION
Ferritin: 11 ng/mL — ABNORMAL LOW (ref 15–150)
Hgb A2 Quant: 2.1 % (ref 1.8–3.2)
Hgb A: 97.9 % (ref 96.4–98.8)
Hgb C: 0 %
Hgb F Quant: 0 % (ref 0.0–2.0)
Hgb S: 0 %
Hgb Solubility: NEGATIVE
Hgb Variant: 0 %

## 2019-01-11 LAB — CYTOLOGY - PAP
Chlamydia: NEGATIVE
Molecular Disclaimer: NEGATIVE
Neisseria Gonorrhea: NEGATIVE

## 2019-01-11 LAB — HEMOGLOBIN A1C
Est. average glucose Bld gHb Est-mCnc: 108 mg/dL
Hgb A1c MFr Bld: 5.4 % (ref 4.8–5.6)

## 2019-01-11 LAB — OBSTETRIC PANEL, INCLUDING HIV
Antibody Screen: NEGATIVE
Basophils Absolute: 0 10*3/uL (ref 0.0–0.2)
Basos: 0 %
EOS (ABSOLUTE): 0.1 10*3/uL (ref 0.0–0.4)
Eos: 1 %
HIV Screen 4th Generation wRfx: NONREACTIVE
Hematocrit: 33 % — ABNORMAL LOW (ref 34.0–46.6)
Hemoglobin: 10.6 g/dL — ABNORMAL LOW (ref 11.1–15.9)
Hepatitis B Surface Ag: NEGATIVE
Immature Grans (Abs): 0 10*3/uL (ref 0.0–0.1)
Immature Granulocytes: 0 %
Lymphocytes Absolute: 2.4 10*3/uL (ref 0.7–3.1)
Lymphs: 28 %
MCH: 23.3 pg — ABNORMAL LOW (ref 26.6–33.0)
MCHC: 32.1 g/dL (ref 31.5–35.7)
MCV: 73 fL — ABNORMAL LOW (ref 79–97)
Monocytes Absolute: 0.8 10*3/uL (ref 0.1–0.9)
Monocytes: 9 %
Neutrophils Absolute: 5.4 10*3/uL (ref 1.4–7.0)
Neutrophils: 62 %
Platelets: 211 10*3/uL (ref 150–450)
RBC: 4.55 x10E6/uL (ref 3.77–5.28)
RDW: 15.2 % (ref 11.7–15.4)
RPR Ser Ql: NONREACTIVE
Rh Factor: POSITIVE
Rubella Antibodies, IGG: 24.7 index (ref >0.99)
WBC: 8.8 10*3/uL (ref 3.4–10.8)

## 2019-01-18 ENCOUNTER — Telehealth: Payer: Self-pay

## 2019-01-18 DIAGNOSIS — Z3400 Encounter for supervision of normal first pregnancy, unspecified trimester: Secondary | ICD-10-CM

## 2019-01-18 NOTE — Telephone Encounter (Signed)
Patient called and made aware that Panorama results are low risk, and that she is having a baby boy. Kathrene Alu RN

## 2019-01-19 ENCOUNTER — Other Ambulatory Visit: Payer: Self-pay

## 2019-01-24 ENCOUNTER — Other Ambulatory Visit: Payer: Self-pay | Admitting: Family Medicine

## 2019-01-24 DIAGNOSIS — D563 Thalassemia minor: Secondary | ICD-10-CM

## 2019-01-27 ENCOUNTER — Other Ambulatory Visit (HOSPITAL_COMMUNITY): Payer: Self-pay | Admitting: *Deleted

## 2019-01-27 ENCOUNTER — Ambulatory Visit (HOSPITAL_COMMUNITY)
Admission: RE | Admit: 2019-01-27 | Discharge: 2019-01-27 | Disposition: A | Discharge status: Home / Self Care | Payer: Medicaid Other | Source: Ambulatory Visit | Attending: Obstetrics & Gynecology | Admitting: Obstetrics & Gynecology

## 2019-01-27 ENCOUNTER — Other Ambulatory Visit: Payer: Self-pay

## 2019-01-27 ENCOUNTER — Other Ambulatory Visit: Payer: Self-pay | Admitting: Obstetrics & Gynecology

## 2019-01-27 ENCOUNTER — Encounter: Payer: Self-pay | Admitting: Obstetrics & Gynecology

## 2019-01-27 DIAGNOSIS — Z148 Genetic carrier of other disease: Secondary | ICD-10-CM

## 2019-01-27 DIAGNOSIS — Z3A19 19 weeks gestation of pregnancy: Secondary | ICD-10-CM | POA: Diagnosis not present

## 2019-01-27 DIAGNOSIS — Z362 Encounter for other antenatal screening follow-up: Secondary | ICD-10-CM

## 2019-01-27 DIAGNOSIS — O99212 Obesity complicating pregnancy, second trimester: Secondary | ICD-10-CM

## 2019-01-27 DIAGNOSIS — Z3400 Encounter for supervision of normal first pregnancy, unspecified trimester: Secondary | ICD-10-CM | POA: Diagnosis present

## 2019-01-27 DIAGNOSIS — O99012 Anemia complicating pregnancy, second trimester: Secondary | ICD-10-CM

## 2019-01-27 DIAGNOSIS — A599 Trichomoniasis, unspecified: Secondary | ICD-10-CM

## 2019-01-27 DIAGNOSIS — R87612 Low grade squamous intraepithelial lesion on cytologic smear of cervix (LGSIL): Secondary | ICD-10-CM | POA: Insufficient documentation

## 2019-01-27 MED ORDER — METRONIDAZOLE 500 MG PO TABS: ORAL_TABLET | ORAL | 0 refills | Status: DC

## 2019-01-30 ENCOUNTER — Telehealth: Payer: Self-pay

## 2019-01-30 NOTE — Telephone Encounter (Signed)
-----   Message from Lavonia Drafts, MD sent at 01/27/2019  2:33 PM EDT ----- Please call pt. She has trich. She needs treatment and her partner as well. I have sent the Rx for pt. You may need to call in Rx for partner. Please notify pt.   Thx,  clh-S

## 2019-01-30 NOTE — Telephone Encounter (Signed)
Called pt in regards to results. Pt made aware that she tested positive for Trich. Medication was sent to the pharmacy.  chiquita l wilson, CMA

## 2019-02-07 ENCOUNTER — Ambulatory Visit (INDEPENDENT_AMBULATORY_CARE_PROVIDER_SITE_OTHER): Payer: Medicaid Other | Admitting: Advanced Practice Midwife

## 2019-02-07 ENCOUNTER — Other Ambulatory Visit: Payer: Self-pay

## 2019-02-07 ENCOUNTER — Encounter: Payer: Self-pay | Admitting: Advanced Practice Midwife

## 2019-02-07 DIAGNOSIS — D563 Thalassemia minor: Secondary | ICD-10-CM

## 2019-02-07 DIAGNOSIS — Z3402 Encounter for supervision of normal first pregnancy, second trimester: Secondary | ICD-10-CM

## 2019-02-07 DIAGNOSIS — Z3A20 20 weeks gestation of pregnancy: Secondary | ICD-10-CM

## 2019-02-07 DIAGNOSIS — Z3400 Encounter for supervision of normal first pregnancy, unspecified trimester: Secondary | ICD-10-CM

## 2019-02-07 NOTE — Patient Instructions (Signed)

## 2019-02-07 NOTE — Progress Notes (Signed)
   PRENATAL VISIT NOTE  Subjective:  Tracey Dunn is a 23 y.o. G1P0000 at [redacted]w[redacted]d being seen today for ongoing prenatal care.  She is currently monitored for the following issues for this low-risk pregnancy and has Anemia; Menorrhagia; Obesity due to excess calories; Supervision of normal first pregnancy, antepartum; Obesity affecting pregnancy, antepartum; LGSIL on Pap smear of cervix; OSA (obstructive sleep apnea); Overweight; and Snoring on their problem list.  Patient reports no complaints.  Nausea improved, No cramps or bleeding.   Contractions: Not present. Vag. Bleeding: None.  Movement: Present. Denies leaking of fluid.   The following portions of the patient's history were reviewed and updated as appropriate: allergies, current medications, past family history, past medical history, past social history, past surgical history and problem list.   Objective:   Vitals:   02/07/19 0802  BP: 90/70  Pulse: (!) 104  Weight: 116.1 kg    Fetal Status: Fetal Heart Rate (bpm): 146   Movement: Present     General:  Alert, oriented and cooperative. Patient is in no acute distress.  Skin: Skin is warm and dry. No rash noted.   Cardiovascular: Normal heart rate noted  Respiratory: Normal respiratory effort, no problems with respiration noted  Abdomen: Soft, gravid, appropriate for gestational age.  Pain/Pressure: Absent     Pelvic: Cervical exam deferred        Extremities: Normal range of motion.  Edema: None  Mental Status: Normal mood and affect. Normal behavior. Normal judgment and thought content.   Assessment and Plan:  Pregnancy: G1P0000 at [redacted]w[redacted]d                      Anat [place] normal but incomplete, scheduled for followup                     Glucola at next visit Preterm labor symptoms and general obstetric precautions including but not limited to vaginal bleeding, contractions, leaking of fluid and fetal movement were reviewed in detail with the patient. Please refer to After Visit  Summary for other counseling recommendations.   Return in about 6 weeks (around 03/21/2019) for [employer].  Future Appointments  Date Time Provider [place]  02/24/2019 12:45 PM [place] NURSE [place] [facility]  02/24/2019 12:45 PM [place] 2 [facility]  03/23/2019  8:45 AM Nehemiah Settle, Tanna Savoy, DO [facility] None    Hansel Feinstein, CNM

## 2019-02-13 MED ORDER — CETIRIZINE HCL 10 MG PO TABS: 10.0000 mg | ORAL_TABLET | Freq: Every day | ORAL | 3 refills | Status: DC

## 2019-02-24 ENCOUNTER — Ambulatory Visit (HOSPITAL_COMMUNITY): Payer: Medicaid Other | Admitting: *Deleted

## 2019-02-24 ENCOUNTER — Other Ambulatory Visit: Payer: Self-pay

## 2019-02-24 ENCOUNTER — Ambulatory Visit (HOSPITAL_COMMUNITY)
Admission: RE | Admit: 2019-02-24 | Discharge: 2019-02-24 | Disposition: A | Discharge status: Home / Self Care | Payer: Medicaid Other | Source: Ambulatory Visit | Attending: Obstetrics and Gynecology | Admitting: Obstetrics and Gynecology

## 2019-02-24 ENCOUNTER — Other Ambulatory Visit (HOSPITAL_COMMUNITY): Payer: Self-pay | Admitting: *Deleted

## 2019-02-24 ENCOUNTER — Encounter (HOSPITAL_COMMUNITY): Payer: Self-pay

## 2019-02-24 DIAGNOSIS — Z3400 Encounter for supervision of normal first pregnancy, unspecified trimester: Secondary | ICD-10-CM | POA: Insufficient documentation

## 2019-02-24 DIAGNOSIS — R87612 Low grade squamous intraepithelial lesion on cytologic smear of cervix (LGSIL): Secondary | ICD-10-CM | POA: Insufficient documentation

## 2019-02-24 DIAGNOSIS — O99210 Obesity complicating pregnancy, unspecified trimester: Secondary | ICD-10-CM

## 2019-02-24 DIAGNOSIS — Z362 Encounter for other antenatal screening follow-up: Secondary | ICD-10-CM | POA: Diagnosis present

## 2019-02-24 DIAGNOSIS — Z3A23 23 weeks gestation of pregnancy: Secondary | ICD-10-CM | POA: Diagnosis not present

## 2019-02-24 DIAGNOSIS — Z148 Genetic carrier of other disease: Secondary | ICD-10-CM | POA: Diagnosis not present

## 2019-02-24 DIAGNOSIS — O99212 Obesity complicating pregnancy, second trimester: Secondary | ICD-10-CM

## 2019-02-26 ENCOUNTER — Emergency Department (HOSPITAL_BASED_OUTPATIENT_CLINIC_OR_DEPARTMENT_OTHER)
Admission: EM | Admit: 2019-02-26 | Discharge: 2019-02-26 | Disposition: A | Discharge status: Home / Self Care | Payer: Medicaid Other | Attending: Emergency Medicine | Admitting: Emergency Medicine

## 2019-02-26 ENCOUNTER — Other Ambulatory Visit: Payer: Self-pay

## 2019-02-26 ENCOUNTER — Encounter (HOSPITAL_BASED_OUTPATIENT_CLINIC_OR_DEPARTMENT_OTHER): Payer: Self-pay

## 2019-02-26 DIAGNOSIS — M549 Dorsalgia, unspecified: Secondary | ICD-10-CM | POA: Diagnosis not present

## 2019-02-26 DIAGNOSIS — O26892 Other specified pregnancy related conditions, second trimester: Secondary | ICD-10-CM | POA: Insufficient documentation

## 2019-02-26 DIAGNOSIS — Z79899 Other long term (current) drug therapy: Secondary | ICD-10-CM | POA: Insufficient documentation

## 2019-02-26 DIAGNOSIS — M79662 Pain in left lower leg: Secondary | ICD-10-CM | POA: Diagnosis not present

## 2019-02-26 DIAGNOSIS — G5702 Lesion of sciatic nerve, left lower limb: Secondary | ICD-10-CM | POA: Diagnosis not present

## 2019-02-26 DIAGNOSIS — M79605 Pain in left leg: Secondary | ICD-10-CM

## 2019-02-26 DIAGNOSIS — Z3A23 23 weeks gestation of pregnancy: Secondary | ICD-10-CM | POA: Insufficient documentation

## 2019-02-26 MED ORDER — ACETAMINOPHEN 500 MG PO TABS: 500.0000 mg | ORAL_TABLET | Freq: Four times a day (QID) | ORAL | 0 refills | Status: DC | PRN

## 2019-02-26 MED ORDER — ACETAMINOPHEN 325 MG PO TABS
650.0000 mg | ORAL_TABLET | Freq: Once | ORAL | Status: AC
  Administered 2019-02-26: 23:00:00 650 mg via ORAL
  Filled 2019-02-26: qty 2

## 2019-02-26 NOTE — Discharge Instructions (Addendum)
You were seen today for back pain.  Your pain is consistent with sciatica or piriformis syndrome.  You need to start stretches.  Take Tylenol as needed for pain.  Regarding your left leg pain, you need to return for ultrasound imaging later today to rule out a blood clot.  If you develop chest pain or shortness of breath, you should be reevaluated immediately.

## 2019-02-26 NOTE — ED Triage Notes (Signed)
Pt is [redacted] weeks pregnant. Pt c/o intermittent low back pain with radiation down L leg. Pt denies pelvic pain or vaginal discharge/bleeding.

## 2019-02-26 NOTE — ED Provider Notes (Signed)
[facility] EMERGENCY DEPARTMENT Provider Note   CSN: [id number] Arrival date & time: 02/26/19  2108     History   Chief Complaint Chief Complaint  Patient presents with  . Back Pain    HPI Tracey Dunn is a 23 y.o. female.     HPI  This is a 23 year old female currently approximately [redacted] weeks pregnant who presents with back pain.  Patient reports that she has had back pain on and off throughout the pregnancy.  Pain starts in her back and radiates into her left buttock.  However over the last 24 hours she has had increasing pain.  She rates her pain 8 out of 10.  It is worse with certain sitting positions and movements.  Today she also noted some pain in the medial aspect of her left calf.  She is not noted any swelling or redness.  She has not taken anything for her symptoms.  Regarding her pregnancy, this is her first pregnancy.  Denies any pregnancy related complaints including loss of fluids, vaginal bleeding, abdominal pain.  She reports baby is moving appropriately.  Past Medical History:  Diagnosis Date  . Anemia     Patient Active Problem List   Diagnosis Date Noted  . Alpha thalassemia silent carrier 02/07/2019  . 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 Supervision of normal first pregnancy, antepartum 01/06/2019  . Obesity affecting pregnancy, antepartum 01/06/2019  . Overweight 09/23/2017  . OSA (obstructive sleep apnea) 09/10/2016  . Snoring 09/10/2016  . Anemia 04/01/2015  . Menorrhagia 04/01/2015  . Obesity due to excess calories 04/01/2015    Past Surgical History:  Procedure Laterality Date  . TONSILLECTOMY    . WISDOM TOOTH EXTRACTION       OB History    Gravida  1   Para  0   Term  0   Preterm  0   AB  0   Living  0     SAB  0   TAB  0   Ectopic  0   Multiple  0   Live Births               Home Medications    Prior to Admission medications   Medication Sig Start Date End Date Taking? Authorizing Provider   cetirizine (ZYRTEC) 10 MG tablet Take 1 tablet (10 mg total) by mouth daily. 02/13/19  Yes Levie HeritageStinson, Jacob J, DO  fluticasone (FLONASE) 50 MCG/ACT nasal spray Place 1 spray into both nostrils daily. 07/01/18  Yes Henderly, Britni A, PA-C  Prenatal Vit-Fe Fumarate-FA (PRENATAL VITAMINS PO) Take by mouth.   Yes [provider]  acetaminophen (TYLENOL) 500 MG tablet Take 1 tablet (500 mg total) by mouth every 6 (six) hours as needed. 02/26/19   Horton, Mayer Maskerourtney F, MD  AMBULATORY NON FORMULARY MEDICATION 1 Device by Other route once a week. Blood pressure cuff/Large  Monitored Regularly at home ICD 10: Z34.90 LROB Patient not taking: Reported on 02/07/2019 01/06/19   Willodean RosenthalHarraway-Smith, Carolyn, MD  budesonide (RHINOCORT AQUA) 32 MCG/ACT nasal spray Place into the nose. 09/10/16   [provider]  cephALEXin (KEFLEX) 500 MG capsule Take 1 capsule (500 mg total) by mouth 2 (two) times daily. Patient not taking: Reported on 01/06/2019 02/26/18   McDonald, Mia A, PA-C  ferrous sulfate 325 (65 FE) MG tablet Take 325 mg by mouth daily with breakfast.    [provider]  guaifenesin (ROBITUSSIN) 100 MG/5ML syrup Take 5-10 mLs (100-200  mg total) by mouth every 4 (four) hours as needed for cough. Patient not taking: Reported on 01/06/2019 07/01/18   Henderly, Britni A, PA-C  medroxyPROGESTERone (PROVERA) 10 MG tablet Take 1 tablet (10 mg total) by mouth daily. Patient not taking: Reported on 01/06/2019 02/26/18   McDonald, Mia A, PA-C  metroNIDAZOLE (FLAGYL) 500 MG tablet Take two tablets by mouth twice a day, for one day.  Or you can take all four tablets at once if you can tolerate it. Patient not taking: Reported on 02/07/2019 01/27/19   Lavonia Drafts, MD    Family History Family History  Problem Relation Age of Onset  . Hypertension Mother   . Diabetes Mother        Type 1  . Hypertension Maternal Grandmother   . Cancer Neg Hx   . Stroke Neg Hx     Social History Social  History   Tobacco Use  . Smoking status: Never Smoker  . Smokeless tobacco: Never Used  Substance Use Topics  . Alcohol use: Not Currently    Comment: occ  . Drug use: No     Allergies   Other, Sulfa antibiotics, and Sulfasalazine   Review of Systems Review of Systems  Constitutional: Negative for fever.  Respiratory: Negative for shortness of breath.   Cardiovascular: Negative for chest pain and leg swelling.  Gastrointestinal: Negative for abdominal pain.  Genitourinary: Negative for vaginal bleeding.  Musculoskeletal: Positive for back pain.       Left calf tenderness  Skin: Negative for rash.  Neurological: Negative for numbness.  All other systems reviewed and are negative.    Physical Exam Updated Vital Signs BP 109/66 (BP Location: Right Arm)   Pulse (!) 119   Temp 98.8 F (37.1 C) (Oral)   Resp 18   Ht 1.651 m (5\' 5" )   Wt 113.4 kg   LMP 09/16/2018 (Exact Date)   SpO2 100%   BMI 41.60 kg/m   Physical Exam Vitals signs and nursing note reviewed.  Constitutional:      General: She is not in acute distress.    Appearance: She is well-developed. She is obese. She is not ill-appearing.  HENT:     Head: Normocephalic and atraumatic.  Eyes:     Pupils: Pupils are equal, round, and reactive to light.  Neck:     Musculoskeletal: Neck supple.  Cardiovascular:     Rate and Rhythm: Normal rate and regular rhythm.     Heart sounds: Normal heart sounds.  Pulmonary:     Effort: Pulmonary effort is normal. No respiratory distress.     Breath sounds: No wheezing.  Abdominal:     General: Bowel sounds are normal.     Palpations: Abdomen is soft.     Tenderness: There is no abdominal tenderness.     Comments: Gravid  Musculoskeletal:     Comments: Tenderness palpation lower lumbar spine, no step-off or deformity, there is tenderness over the gluteal muscles and piriformis the left buttock, positive straight leg raise on the left, neurovascularly intact  distally with normal reflexes and strength Point tenderness to palpation of the medial aspect of the left calf, no overlying skin changes, palpable cords, obvious swelling  Skin:    General: Skin is warm and dry.  Neurological:     Mental Status: She is alert and oriented to person, place, and time.     Comments: 5 out of 5 strength bilateral lower extremities, normal gait, no clonus  Psychiatric:  Mood and Affect: Mood normal.      ED Treatments / Results  Labs (all labs ordered are listed, but only abnormal results are displayed) Labs Reviewed - No data to display  EKG None  Radiology No results found.  Procedures Procedures (including critical care time)  Medications Ordered in ED Medications  acetaminophen (TYLENOL) tablet 650 mg (650 mg Oral Given 02/26/19 2317)     Initial Impression / Assessment and Plan / ED Course  I have reviewed the triage vital signs and the nursing notes.  Pertinent labs & imaging results that were available during my care of the patient were reviewed by me and considered in my medical decision making (see chart for details).        Patient presents with back pain.  Also noted some left calf tenderness that started today.  She is overall nontoxic and vital signs are reassuring.  Her back pain and left buttock pain is most consistent with sciatica and piriformis syndrome.  Pregnancy has likely exacerbated this condition.  She is limited with what she can take for this.  I have recommended exercises and Tylenol for pain.  She has some point tenderness over the left calf.  No obvious infection or rash.  No obvious asymmetric swelling although given her pregnancy, she would be at increased risk for DVT.  We will have her return for ultrasound later today.  Denies chest pain or shortness of breath.  After history, exam, and medical workup I feel the patient has been appropriately medically screened and is safe for discharge home. Pertinent  diagnoses were discussed with the patient. Patient was given return precautions.   Final Clinical Impressions(s) / ED Diagnoses   Final diagnoses:  Pyriformis syndrome, left  Left leg pain    ED Discharge Orders         Ordered    acetaminophen (TYLENOL) 500 MG tablet  Every 6 hours PRN     02/26/19 2332    US Venous Img Lower Unilateral Left     02/26/19 2332           Shon Baton, MD 02/27/19 0109

## 2019-02-27 ENCOUNTER — Other Ambulatory Visit: Payer: Self-pay

## 2019-02-27 ENCOUNTER — Ambulatory Visit (HOSPITAL_BASED_OUTPATIENT_CLINIC_OR_DEPARTMENT_OTHER)
Admission: RE | Admit: 2019-02-27 | Discharge: 2019-02-27 | Disposition: A | Discharge status: Home / Self Care | Payer: Medicaid Other | Source: Ambulatory Visit | Attending: Emergency Medicine | Admitting: Emergency Medicine

## 2019-02-27 ENCOUNTER — Emergency Department (HOSPITAL_BASED_OUTPATIENT_CLINIC_OR_DEPARTMENT_OTHER)
Admission: EM | Admit: 2019-02-27 | Discharge: 2019-02-27 | Discharge status: Absent without leave | Payer: Medicaid Other

## 2019-02-27 DIAGNOSIS — M79605 Pain in left leg: Secondary | ICD-10-CM | POA: Insufficient documentation

## 2019-03-02 ENCOUNTER — Ambulatory Visit (INDEPENDENT_AMBULATORY_CARE_PROVIDER_SITE_OTHER): Payer: Medicaid Other | Admitting: Family Medicine

## 2019-03-02 ENCOUNTER — Other Ambulatory Visit: Payer: Self-pay

## 2019-03-02 VITALS — BP 126/80 | HR 108 | Wt 258.0 lb

## 2019-03-02 DIAGNOSIS — M9905 Segmental and somatic dysfunction of pelvic region: Secondary | ICD-10-CM

## 2019-03-02 DIAGNOSIS — O99212 Obesity complicating pregnancy, second trimester: Secondary | ICD-10-CM

## 2019-03-02 DIAGNOSIS — M9903 Segmental and somatic dysfunction of lumbar region: Secondary | ICD-10-CM | POA: Diagnosis not present

## 2019-03-02 DIAGNOSIS — Z3400 Encounter for supervision of normal first pregnancy, unspecified trimester: Secondary | ICD-10-CM

## 2019-03-02 DIAGNOSIS — M9904 Segmental and somatic dysfunction of sacral region: Secondary | ICD-10-CM

## 2019-03-02 DIAGNOSIS — Z3A23 23 weeks gestation of pregnancy: Secondary | ICD-10-CM

## 2019-03-02 DIAGNOSIS — M5432 Sciatica, left side: Secondary | ICD-10-CM

## 2019-03-02 DIAGNOSIS — O99210 Obesity complicating pregnancy, unspecified trimester: Secondary | ICD-10-CM

## 2019-03-02 MED ORDER — CYCLOBENZAPRINE HCL 10 MG PO TABS: 5.0000 mg | ORAL_TABLET | Freq: Three times a day (TID) | ORAL | 1 refills | Status: DC | PRN

## 2019-03-02 NOTE — Progress Notes (Signed)
   PRENATAL VISIT NOTE  Subjective:  Tracey Dunn is a 23 y.o. G1P0000 at [redacted]w[redacted]d being seen today for ongoing prenatal care.  She is currently monitored for the following issues for this high-risk pregnancy and has Anemia; Menorrhagia; Obesity due to excess calories; Supervision of normal first pregnancy, antepartum; Obesity affecting pregnancy, antepartum; LGSIL on Pap smear of cervix; OSA (obstructive sleep apnea); Overweight; Snoring; and Alpha thalassemia silent carrier on their problem list.  Patient reports backache.  Contractions: Not present. Vag. Bleeding: None.  Movement: Present. Denies leaking of fluid.   The following portions of the patient's history were reviewed and updated as appropriate: allergies, current medications, past family history, past medical history, past social history, past surgical history and problem list. Problem list updated.  Objective:   Vitals:   03/02/19 1122  BP: 126/80  Pulse: (!) 108  Weight: 258 lb (117 kg)    Fetal Status: Fetal Heart Rate (bpm): 150   Movement: Present     General:  Alert, oriented and cooperative. Patient is in no acute distress.  Skin: Skin is warm and dry. No rash noted.   Cardiovascular: Normal heart rate noted  Respiratory: Normal respiratory effort, no problems with respiration noted  Abdomen: Soft, gravid, appropriate for gestational age. Pain/Pressure: Absent     Pelvic:  Cervical exam deferred        MSK: Restriction, tenderness, tissue texture changes, and paraspinal spasm in the lumbar spine  Neuro: Moves all four extremities with no focal neurological deficit  Extremities: Normal range of motion.  Edema: Trace  Mental Status: Normal mood and affect. Normal behavior. Normal judgment and thought content.   OSE: Head   Cervical   Thoracic   Rib   Lumbar L5 ESRL  Sacrum L/L  Pelvis Right ant innom    Assessment and Plan:  Pregnancy: G1P0000 at [redacted]w[redacted]d  1. Supervision of normal first pregnancy, antepartum  FHT and FH normal. 2hr GTT next appt.  2. Obesity affecting pregnancy, antepartum   3. Sciatica of left side 4. Somatic dysfunction of lumbar region 5. Somatic dysfunction of pelvis region 6. Somatic dysfunction of sacral region Flexeril. OMT done after patient permission. HVLA technique utilized. 3 areas treated with improvement of tissue texture and joint mobility. Patient tolerated procedure well.    Preterm labor symptoms and general obstetric precautions including but not limited to vaginal bleeding, contractions, leaking of fluid and fetal movement were reviewed in detail with the patient. Please refer to After Visit Summary for other counseling recommendations.  Return in about 3 weeks (around 03/23/2019) for OB f/u, 2 hr GTT.  Truett Mainland, DO

## 2019-03-23 ENCOUNTER — Encounter: Payer: Medicaid Other | Admitting: Family Medicine

## 2019-03-24 ENCOUNTER — Ambulatory Visit (HOSPITAL_COMMUNITY)
Admission: RE | Admit: 2019-03-24 | Discharge: 2019-03-24 | Disposition: A | Discharge status: Home / Self Care | Payer: Medicaid Other | Source: Ambulatory Visit | Attending: Obstetrics and Gynecology | Admitting: Obstetrics and Gynecology

## 2019-03-24 ENCOUNTER — Ambulatory Visit (HOSPITAL_COMMUNITY): Payer: Medicaid Other | Admitting: *Deleted

## 2019-03-24 ENCOUNTER — Other Ambulatory Visit (HOSPITAL_COMMUNITY): Payer: Self-pay | Admitting: *Deleted

## 2019-03-24 ENCOUNTER — Other Ambulatory Visit: Payer: Self-pay

## 2019-03-24 ENCOUNTER — Encounter (HOSPITAL_COMMUNITY): Payer: Self-pay

## 2019-03-24 DIAGNOSIS — Z3400 Encounter for supervision of normal first pregnancy, unspecified trimester: Secondary | ICD-10-CM | POA: Insufficient documentation

## 2019-03-24 DIAGNOSIS — Z3A27 27 weeks gestation of pregnancy: Secondary | ICD-10-CM | POA: Diagnosis not present

## 2019-03-24 DIAGNOSIS — Z362 Encounter for other antenatal screening follow-up: Secondary | ICD-10-CM

## 2019-03-24 DIAGNOSIS — O99210 Obesity complicating pregnancy, unspecified trimester: Secondary | ICD-10-CM

## 2019-03-24 DIAGNOSIS — O99212 Obesity complicating pregnancy, second trimester: Secondary | ICD-10-CM

## 2019-03-24 DIAGNOSIS — O99012 Anemia complicating pregnancy, second trimester: Secondary | ICD-10-CM

## 2019-03-24 DIAGNOSIS — R87612 Low grade squamous intraepithelial lesion on cytologic smear of cervix (LGSIL): Secondary | ICD-10-CM | POA: Insufficient documentation

## 2019-03-28 ENCOUNTER — Telehealth: Payer: Self-pay

## 2019-03-28 NOTE — Telephone Encounter (Signed)
[employer] called stating that they had abnormal reading for patient of 141/76.  Attempted to reach patient- left message. Kathrene Alu RN

## 2019-03-30 ENCOUNTER — Encounter: Payer: Self-pay | Admitting: Obstetrics & Gynecology

## 2019-03-30 ENCOUNTER — Other Ambulatory Visit: Payer: Self-pay

## 2019-03-30 ENCOUNTER — Ambulatory Visit (INDEPENDENT_AMBULATORY_CARE_PROVIDER_SITE_OTHER): Payer: Medicaid Other | Admitting: Obstetrics & Gynecology

## 2019-03-30 VITALS — BP 108/64 | HR 124 | Wt 257.0 lb

## 2019-03-30 DIAGNOSIS — D563 Thalassemia minor: Secondary | ICD-10-CM

## 2019-03-30 DIAGNOSIS — Z23 Encounter for immunization: Secondary | ICD-10-CM | POA: Diagnosis not present

## 2019-03-30 DIAGNOSIS — Z3A27 27 weeks gestation of pregnancy: Secondary | ICD-10-CM

## 2019-03-30 DIAGNOSIS — O99212 Obesity complicating pregnancy, second trimester: Secondary | ICD-10-CM

## 2019-03-30 DIAGNOSIS — O99210 Obesity complicating pregnancy, unspecified trimester: Secondary | ICD-10-CM

## 2019-03-30 DIAGNOSIS — Z3400 Encounter for supervision of normal first pregnancy, unspecified trimester: Secondary | ICD-10-CM

## 2019-03-30 NOTE — Progress Notes (Signed)
   PRENATAL VISIT NOTE  Subjective:  Tracey Dunn is a 23 y.o. G1P0000 at [redacted]w[redacted]d being seen today for ongoing prenatal care.  She is currently monitored for the following issues for this low-risk pregnancy and has Anemia; Menorrhagia; Obesity due to excess calories; Supervision of normal first pregnancy, antepartum; Obesity affecting pregnancy, antepartum; LGSIL on Pap smear of cervix; OSA (obstructive sleep apnea); Overweight; Snoring; and Alpha thalassemia silent carrier on their problem list.  Patient reports nausea and dizziness.  Contractions: Not present. Vag. Bleeding: None.  Movement: Present. Denies leaking of fluid.   The following portions of the patient's history were reviewed and updated as appropriate: allergies, current medications, past family history, past medical history, past social history, past surgical history and problem list.   Objective:   Vitals:   03/30/19 0956  BP: 108/64  Pulse: (!) 124  Weight: 257 lb (116.6 kg)    Fetal Status: Fetal Heart Rate (bpm): 154   Movement: Present     General:  Alert, oriented and cooperative. Patient is in no acute distress.  Skin: Skin is warm and dry. No rash noted.   Cardiovascular: Normal heart rate noted  Respiratory: Normal respiratory effort, no problems with respiration noted  Abdomen: Soft, gravid, appropriate for gestational age.  Pain/Pressure: Present     Pelvic: Cervical exam deferred        Extremities: Normal range of motion.  Edema: None  Mental Status: Normal mood and affect. Normal behavior. Normal judgment and thought content.   Assessment and Plan:  Pregnancy: G1P0000 at [redacted]w[redacted]d 1. Supervision of normal first pregnancy, antepartum 28 week labs today - Tdap vaccine greater than or equal to 7yo IM  2. Obesity affecting pregnancy, antepartum Has [place] appt in early Jan for growth  3. Alpha thalassemia silent carrier H/o anemia. Will check labs to see if this is the cause of her dizziness  CBC Latest Ref  Rng & Units 01/06/2019 02/26/2018 06/30/2017  WBC 3.4 - 10.8 x10E3/uL 8.8 6.8 8.6  Hemoglobin 11.1 - 15.9 g/dL 10.6(L) 11.7(L) 12.0  Hematocrit 34.0 - 46.6 % 33.0(L) 38.8 38.1  Platelets 150 - 450 x10E3/uL 211 241 232   4. Back pain Flexerile not working   Reviewed maternity belt and back exercises.   Preterm labor symptoms and general obstetric precautions including but not limited to vaginal bleeding, contractions, leaking of fluid and fetal movement were reviewed in detail with the patient. Please refer to After Visit Summary for other counseling recommendations.   Return in about 2 weeks (around 04/13/2019) for in person.  Future Appointments  Date Time Provider [place]  03/30/2019 10:45 AM Lavonia Drafts, MD [facility] None  05/05/2019  1:15 PM [facility] [place] 4 [facility]  05/05/2019  1:20 PM [facility] NURSE [facility]    Lavonia Drafts, MD

## 2019-03-30 NOTE — Patient Instructions (Addendum)
Third Trimester of Pregnancy The third trimester is from week 28 through week 40 (months 7 through 9). The third trimester is a time when the unborn baby (fetus) is growing rapidly. At the end of the ninth month, the fetus is about 20 inches in length and weighs 6-10 pounds. Body changes during your third trimester Your body will continue to go through many changes during pregnancy. The changes vary from woman to woman. During the third trimester:  Your weight will continue to increase. You can expect to gain 25-35 pounds (11-16 kg) by the end of the pregnancy.  You may begin to get stretch marks on your hips, abdomen, and breasts.  You may urinate more often because the fetus is moving lower into your pelvis and pressing on your bladder.  You may develop or continue to have heartburn. This is caused by increased hormones that slow down muscles in the digestive tract.  You may develop or continue to have constipation because increased hormones slow digestion and cause the muscles that push waste through your intestines to relax.  You may develop hemorrhoids. These are swollen veins (varicose veins) in the rectum that can itch or be painful.  You may develop swollen, bulging veins (varicose veins) in your legs.  You may have increased body aches in the pelvis, back, or thighs. This is due to weight gain and increased hormones that are relaxing your joints.  You may have changes in your hair. These can include thickening of your hair, rapid growth, and changes in texture. Some women also have hair loss during or after pregnancy, or hair that feels dry or thin. Your hair will most likely return to normal after your baby is born.  Your breasts will continue to grow and they will continue to become tender. A yellow fluid (colostrum) may leak from your breasts. This is the first milk you are producing for your baby.  Your belly button may stick out.  You may notice more swelling in your hands,  face, or ankles.  You may have increased tingling or numbness in your hands, arms, and legs. The skin on your belly may also feel numb.  You may feel short of breath because of your expanding uterus.  You may have more problems sleeping. This can be caused by the size of your belly, increased need to urinate, and an increase in your body's metabolism.  You may notice the fetus "dropping," or moving lower in your abdomen (lightening).  You may have increased vaginal discharge.  You may notice your joints feel loose and you may have pain around your pelvic bone. What to expect at prenatal visits You will have prenatal exams every 2 weeks until week 36. Then you will have weekly prenatal exams. During a routine prenatal visit:  You will be weighed to make sure you and the baby are growing normally.  Your blood pressure will be taken.  Your abdomen will be measured to track your baby's growth.  The fetal heartbeat will be listened to.  Any test results from the previous visit will be discussed.  You may have a cervical check near your due date to see if your cervix has softened or thinned (effaced).  You will be tested for Group B streptococcus. This happens between 35 and 37 weeks. Your health care provider may ask you:  What your birth plan is.  How you are feeling.  If you are feeling the baby move.  If you have had any abnormal   symptoms, such as leaking fluid, bleeding, severe headaches, or abdominal cramping.  If you are using any tobacco products, including cigarettes, chewing tobacco, and electronic cigarettes.  If you have any questions. Other tests or screenings that may be performed during your third trimester include:  Blood tests that check for low iron levels (anemia).  Fetal testing to check the health, activity level, and growth of the fetus. Testing is done if you have certain medical conditions or if there are problems during the pregnancy.  Nonstress test  (NST). This test checks the health of your baby to make sure there are no signs of problems, such as the baby not getting enough oxygen. During this test, a belt is placed around your belly. The baby is made to move, and its heart rate is monitored during movement. What is false labor? False labor is a condition in which you feel small, irregular tightenings of the muscles in the womb (contractions) that usually go away with rest, changing position, or drinking water. These are called Braxton Hicks contractions. Contractions may last for hours, days, or even weeks before true labor sets in. If contractions come at regular intervals, become more frequent, increase in intensity, or become painful, you should see your health care provider. What are the signs of labor?  Abdominal cramps.  Regular contractions that start at 10 minutes apart and become stronger and more frequent with time.  Contractions that start on the top of the uterus and spread down to the lower abdomen and back.  Increased pelvic pressure and dull back pain.  A watery or bloody mucus discharge that comes from the vagina.  Leaking of amniotic fluid. This is also known as your "water breaking." It could be a slow trickle or a gush. Let your health care provider know if it has a color or strange odor. If you have any of these signs, call your health care provider right away, even if it is before your due date. Follow these instructions at home: Medicines  Follow your health care provider's instructions regarding medicine use. Specific medicines may be either safe or unsafe to take during pregnancy.  Take a prenatal vitamin that contains at least 600 micrograms (mcg) of folic acid.  If you develop constipation, try taking a stool softener if your health care provider approves. Eating and drinking   Eat a balanced diet that includes fresh fruits and vegetables, whole grains, good sources of protein such as meat, eggs, or tofu,  and low-fat dairy. Your health care provider will help you determine the amount of weight gain that is right for you.  Avoid raw meat and uncooked cheese. These carry germs that can cause birth defects in the baby.  If you have low calcium intake from food, talk to your health care provider about whether you should take a daily calcium supplement.  Eat four or five small meals rather than three large meals a day.  Limit foods that are high in fat and processed sugars, such as fried and sweet foods.  To prevent constipation: ? Drink enough fluid to keep your urine clear or pale yellow. ? Eat foods that are high in fiber, such as fresh fruits and vegetables, whole grains, and beans. Activity  Exercise only as directed by your health care provider. Most women can continue their usual exercise routine during pregnancy. Try to exercise for 30 minutes at least 5 days a week. Stop exercising if you experience uterine contractions.  Avoid heavy lifting.  Do   not exercise in extreme heat or humidity, or at high altitudes.  Wear low-heel, comfortable shoes.  Practice good posture.  You may continue to have sex unless your health care provider tells you otherwise. Relieving pain and discomfort  Take frequent breaks and rest with your legs elevated if you have leg cramps or low back pain.  Take warm sitz baths to soothe any pain or discomfort caused by hemorrhoids. Use hemorrhoid cream if your health care provider approves.  Wear a good support bra to prevent discomfort from breast tenderness.  If you develop varicose veins: ? Wear support pantyhose or compression stockings as told by your healthcare provider. ? Elevate your feet for 15 minutes, 3-4 times a day. Prenatal care  Write down your questions. Take them to your prenatal visits.  Keep all your prenatal visits as told by your health care provider. This is important. Safety  Wear your seat belt at all times when driving.  Make  a list of emergency phone numbers, including numbers for family, friends, the hospital, and police and fire departments. General instructions  Avoid cat litter boxes and soil used by cats. These carry germs that can cause birth defects in the baby. If you have a cat, ask someone to clean the litter box for you.  Do not travel far distances unless it is absolutely necessary and only with the approval of your health care provider.  Do not use hot tubs, steam rooms, or saunas.  Do not drink alcohol.  Do not use any products that contain nicotine or tobacco, such as cigarettes and e-cigarettes. If you need help quitting, ask your health care provider.  Do not use any medicinal herbs or unprescribed drugs. These chemicals affect the formation and growth of the baby.  Do not douche or use tampons or scented sanitary pads.  Do not cross your legs for long periods of time.  To prepare for the arrival of your baby: ? Take prenatal classes to understand, practice, and ask questions about labor and delivery. ? Make a trial run to the hospital. ? Visit the hospital and tour the maternity area. ? Arrange for maternity or paternity leave through employers. ? Arrange for family and friends to take care of pets while you are in the hospital. ? Purchase a rear-facing car seat and make sure you know how to install it in your car. ? Pack your hospital bag. ? Prepare the baby's nursery. Make sure to remove all pillows and stuffed animals from the baby's crib to prevent suffocation.  Visit your dentist if you have not gone during your pregnancy. Use a soft toothbrush to brush your teeth and be gentle when you floss. Contact a health care provider if:  You are unsure if you are in labor or if your water has broken.  You become dizzy.  You have mild pelvic cramps, pelvic pressure, or nagging pain in your abdominal area.  You have lower back pain.  You have persistent nausea, vomiting, or diarrhea.   You have an unusual or bad smelling vaginal discharge.  You have pain when you urinate. Get help right away if:  Your water breaks before 37 weeks.  You have regular contractions less than 5 minutes apart before 37 weeks.  You have a fever.  You are leaking fluid from your vagina.  You have spotting or bleeding from your vagina.  You have severe abdominal pain or cramping.  You have rapid weight loss or weight gain.  You have   shortness of breath with chest pain.  You notice sudden or extreme swelling of your face, hands, ankles, feet, or legs.  Your baby makes fewer than 10 movements in 2 hours.  You have severe headaches that do not go away when you take medicine.  You have vision changes. Summary  The third trimester is from week 28 through week 40, months 7 through 9. The third trimester is a time when the unborn baby (fetus) is growing rapidly.  During the third trimester, your discomfort may increase as you and your baby continue to gain weight. You may have abdominal, leg, and back pain, sleeping problems, and an increased need to urinate.  During the third trimester your breasts will keep growing and they will continue to become tender. A yellow fluid (colostrum) may leak from your breasts. This is the first milk you are producing for your baby.  False labor is a condition in which you feel small, irregular tightenings of the muscles in the womb (contractions) that eventually go away. These are called Braxton Hicks contractions. Contractions may last for hours, days, or even weeks before true labor sets in.  Signs of labor can include: abdominal cramps; regular contractions that start at 10 minutes apart and become stronger and more frequent with time; watery or bloody mucus discharge that comes from the vagina; increased pelvic pressure and dull back pain; and leaking of amniotic fluid. This information is not intended to replace advice given to you by your health  care provider. Make sure you discuss any questions you have with your health care provider. Document Released: 03/31/2001 Document Revised: 07/28/2018 Document Reviewed: 05/12/2016 Elsevier Patient Education  2020 [employer]   Back Exercises The following exercises strengthen the muscles that help to support the trunk and back. They also help to keep the lower back flexible. Doing these exercises can help to prevent back pain or lessen existing pain.  If you have back pain or discomfort, try doing these exercises 2-3 times each day or as told by your health care provider.  As your pain improves, do them once each day, but increase the number of times that you repeat the steps for each exercise (do more repetitions).  To prevent the recurrence of back pain, continue to do these exercises once each day or as told by your health care provider. Do exercises exactly as told by your health care provider and adjust them as directed. It is normal to feel mild stretching, pulling, tightness, or discomfort as you do these exercises, but you should stop right away if you feel sudden pain or your pain gets worse. Exercises Single knee to chest Repeat these steps 3-5 times for each leg: 1. Lie on your back on a firm bed or the floor with your legs extended. 2. Bring one knee to your chest. Your other leg should stay extended and in contact with the floor. 3. Hold your knee in place by grabbing your knee or thigh with both hands and hold. 4. Pull on your knee until you feel a gentle stretch in your lower back or buttocks. 5. Hold the stretch for 10-30 seconds. 6. Slowly release and straighten your leg. Pelvic tilt Repeat these steps 5-10 times: 1. Lie on your back on a firm bed or the floor with your legs extended. 2. Bend your knees so they are pointing toward the ceiling and your feet are flat on the floor. 3. Tighten your lower abdominal muscles to press your lower back against  the floor. This  motion will tilt your pelvis so your tailbone points up toward the ceiling instead of pointing to your feet or the floor. 4. With gentle tension and even breathing, hold this position for 5-10 seconds. Cat-cow Repeat these steps until your lower back becomes more flexible: 1. Get into a hands-and-knees position on a firm surface. Keep your hands under your shoulders, and keep your knees under your hips. You may place padding under your knees for comfort. 2. Let your head hang down toward your chest. Contract your abdominal muscles and point your tailbone toward the floor so your lower back becomes rounded like the back of a cat. 3. Hold this position for 5 seconds. 4. Slowly lift your head, let your abdominal muscles relax and point your tailbone up toward the ceiling so your back forms a sagging arch like the back of a cow. 5. Hold this position for 5 seconds.  Press-ups Repeat these steps 5-10 times: 1. Lie on your abdomen (face-down) on the floor. 2. Place your palms near your head, about shoulder-width apart. 3. Keeping your back as relaxed as possible and keeping your hips on the floor, slowly straighten your arms to raise the top half of your body and lift your shoulders. Do not use your back muscles to raise your upper torso. You may adjust the placement of your hands to make yourself more comfortable. 4. Hold this position for 5 seconds while you keep your back relaxed. 5. Slowly return to lying flat on the floor.  Bridges Repeat these steps 10 times: 1. Lie on your back on a firm surface. 2. Bend your knees so they are pointing toward the ceiling and your feet are flat on the floor. Your arms should be flat at your sides, next to your body. 3. Tighten your buttocks muscles and lift your buttocks off the floor until your waist is at almost the same height as your knees. You should feel the muscles working in your buttocks and the back of your thighs. If you do not feel these muscles,  slide your feet 1-2 inches farther away from your buttocks. 4. Hold this position for 3-5 seconds. 5. Slowly lower your hips to the starting position, and allow your buttocks muscles to relax completely. If this exercise is too easy, try doing it with your arms crossed over your chest. Abdominal crunches Repeat these steps 5-10 times: 1. Lie on your back on a firm bed or the floor with your legs extended. 2. Bend your knees so they are pointing toward the ceiling and your feet are flat on the floor. 3. Cross your arms over your chest. 4. Tip your chin slightly toward your chest without bending your neck. 5. Tighten your abdominal muscles and slowly raise your trunk (torso) high enough to lift your shoulder blades a tiny bit off the floor. Avoid raising your torso higher than that because it can put too much stress on your low back and does not help to strengthen your abdominal muscles. 6. Slowly return to your starting position. Back lifts Repeat these steps 5-10 times: 1. Lie on your abdomen (face-down) with your arms at your sides, and rest your forehead on the floor. 2. Tighten the muscles in your legs and your buttocks. 3. Slowly lift your chest off the floor while you keep your hips pressed to the floor. Keep the back of your head in line with the curve in your back. Your eyes should be looking at the floor.  4. Hold this position for 3-5 seconds. 5. Slowly return to your starting position. Contact a health care provider if:  Your back pain or discomfort gets much worse when you do an exercise.  Your worsening back pain or discomfort does not lessen within 2 hours after you exercise. If you have any of these problems, stop doing these exercises right away. Do not do them again unless your health care provider says that you can. Get help right away if:  You develop sudden, severe back pain. If this happens, stop doing the exercises right away. Do not do them again unless your health  care provider says that you can. This information is not intended to replace advice given to you by your health care provider. Make sure you discuss any questions you have with your health care provider. Document Released: 05/14/2004 Document Revised: 08/11/2018 Document Reviewed: 01/06/2018 Elsevier Patient Education  2020 [employer]

## 2019-03-30 NOTE — Telephone Encounter (Signed)
Patient seen for appointment today. Kathrene Alu Rn

## 2019-03-31 ENCOUNTER — Encounter: Payer: Medicaid Other | Admitting: Obstetrics & Gynecology

## 2019-04-01 LAB — HIV ANTIBODY (ROUTINE TESTING W REFLEX): HIV Screen 4th Generation wRfx: NONREACTIVE

## 2019-04-01 LAB — CBC
Hematocrit: 31 % — ABNORMAL LOW (ref 34.0–46.6)
Hemoglobin: 9.6 g/dL — ABNORMAL LOW (ref 11.1–15.9)
MCH: 21.8 pg — ABNORMAL LOW (ref 26.6–33.0)
MCHC: 31 g/dL — ABNORMAL LOW (ref 31.5–35.7)
MCV: 70 fL — ABNORMAL LOW (ref 79–97)
Platelets: 212 10*3/uL (ref 150–450)
RBC: 4.41 x10E6/uL (ref 3.77–5.28)
RDW: 15.8 % — ABNORMAL HIGH (ref 11.7–15.4)
WBC: 9.6 10*3/uL (ref 3.4–10.8)

## 2019-04-01 LAB — GLUCOSE TOLERANCE, 2 HOURS W/ 1HR
Glucose, 1 hour: 202 mg/dL — ABNORMAL HIGH (ref 65–179)
Glucose, 2 hour: 176 mg/dL — ABNORMAL HIGH (ref 65–152)
Glucose, Fasting: 112 mg/dL — ABNORMAL HIGH (ref 65–91)

## 2019-04-01 LAB — RPR: RPR Ser Ql: NONREACTIVE

## 2019-04-03 DIAGNOSIS — O24419 Gestational diabetes mellitus in pregnancy, unspecified control: Secondary | ICD-10-CM | POA: Insufficient documentation

## 2019-04-05 ENCOUNTER — Telehealth: Payer: Self-pay

## 2019-04-05 DIAGNOSIS — O99210 Obesity complicating pregnancy, unspecified trimester: Secondary | ICD-10-CM

## 2019-04-05 DIAGNOSIS — O99810 Abnormal glucose complicating pregnancy: Secondary | ICD-10-CM

## 2019-04-05 NOTE — Telephone Encounter (Signed)
Error

## 2019-04-05 NOTE — Telephone Encounter (Signed)
Patient responded via my chart. Kathrene Alu RN

## 2019-04-05 NOTE — Telephone Encounter (Signed)
Left message for patient to call office for results (abnormal 2 hour gtt- referral to diabetes and nutrition sent. )Kathrene Alu RN

## 2019-04-11 ENCOUNTER — Other Ambulatory Visit: Payer: Self-pay

## 2019-04-11 ENCOUNTER — Ambulatory Visit (INDEPENDENT_AMBULATORY_CARE_PROVIDER_SITE_OTHER): Payer: Medicaid Other | Admitting: Advanced Practice Midwife

## 2019-04-11 ENCOUNTER — Encounter: Payer: Self-pay | Admitting: Advanced Practice Midwife

## 2019-04-11 VITALS — BP 113/69 | HR 93 | Wt 261.0 lb

## 2019-04-11 DIAGNOSIS — Z3A29 29 weeks gestation of pregnancy: Secondary | ICD-10-CM

## 2019-04-11 DIAGNOSIS — O99891 Other specified diseases and conditions complicating pregnancy: Secondary | ICD-10-CM

## 2019-04-11 DIAGNOSIS — Z3400 Encounter for supervision of normal first pregnancy, unspecified trimester: Secondary | ICD-10-CM

## 2019-04-11 DIAGNOSIS — M549 Dorsalgia, unspecified: Secondary | ICD-10-CM

## 2019-04-11 DIAGNOSIS — O24419 Gestational diabetes mellitus in pregnancy, unspecified control: Secondary | ICD-10-CM

## 2019-04-11 NOTE — Progress Notes (Signed)
   PRENATAL VISIT NOTE  Subjective:  Tracey Dunn is a 23 y.o. G1P0000 at [redacted]w[redacted]d being seen today for ongoing prenatal care.  She is currently monitored for the following issues for this high-risk pregnancy and has Anemia; Menorrhagia; Obesity due to excess calories; Supervision of normal first pregnancy, antepartum; Obesity affecting pregnancy, antepartum; LGSIL on Pap smear of cervix; OSA (obstructive sleep apnea); Overweight; Snoring; Alpha thalassemia silent carrier; and Gestational diabetes mellitus (GDM) on their problem list.  Patient reports no complaints.  Contractions: Not present. Vag. Bleeding: None.  Movement: Present. Denies leaking of fluid.   The following portions of the patient's history were reviewed and updated as appropriate: allergies, current medications, past family history, past medical history, past social history, past surgical history and problem list.   Objective:   Vitals:   04/11/19 0920  BP: 113/69  Pulse: 93  Weight: 261 lb (118.4 kg)    Fetal Status: Fetal Heart Rate (bpm): 140   Movement: Present     General:  Alert, oriented and cooperative. Patient is in no acute distress.  Skin: Skin is warm and dry. No rash noted.   Cardiovascular: Normal heart rate noted  Respiratory: Normal respiratory effort, no problems with respiration noted  Abdomen: Soft, gravid, appropriate for gestational age.  Pain/Pressure: Present     Pelvic: Cervical exam deferred        Extremities: Normal range of motion.  Edema: None  Mental Status: Normal mood and affect. Normal behavior. Normal judgment and thought content.   Assessment and Plan:  Pregnancy: G1P0000 at [redacted]w[redacted]d 1. Gestational diabetes mellitus (GDM) in third trimester, gestational diabetes method of control unspecified     Has class scheduled 04/19/19     I reviewed concepts of GDM and how to do the diet, tips given     Has already been researching it online  2. Supervision of normal first pregnancy,  antepartum      Wants to use OCPs/POPs for contraception      Problem list updated      Discussed postplacental IUD, doesn't want epidural so declines due to pain of insertion.  Discussed nature of insertion, declined  Preterm labor symptoms and general obstetric precautions including but not limited to vaginal bleeding, contractions, leaking of fluid and fetal movement were reviewed in detail with the patient. Please refer to After Visit Summary for other counseling recommendations.   Return in about 2 weeks (around 04/25/2019) for [facility].  Future Appointments  Date Time Provider [place]  04/19/2019  9:00 AM [facility] GDM CLASS [place] [facility]  04/27/2019  2:45 PM [place], DO [facility] None  05/05/2019  1:15 PM [place] 4 [facility]  05/05/2019  1:20 PM [place] NURSE [place] [facility]    Hansel Feinstein, CNM

## 2019-04-11 NOTE — Addendum Note (Signed)
Addended by: Seabron Spates on: 04/11/2019 10:02 AM   Modules accepted: Orders

## 2019-04-11 NOTE — Patient Instructions (Signed)
Gestational Diabetes Mellitus, Self Care When you have gestational diabetes (gestational diabetes mellitus), you must make sure your blood sugar (glucose) stays in a healthy range. You can do this with:  Nutrition.  Exercise.  Lifestyle changes.  Medicines or insulin, if needed.  Support from your doctors and others. If you get treated for this condition, it may not hurt you or your unborn baby (fetus). If you do not get treated for this condition, it may cause problems that can hurt you or your unborn baby. If you get gestational diabetes, you are:  More likely to get it if you get pregnant again.  More likely to develop type 2 diabetes in the future. How to stay aware of blood sugar   Check your blood sugar every day while you are pregnant. Check it as often as told.  Call your doctor if your blood sugar is above your goal numbers for two tests in a row. Your doctor will set personal treatment goals for you. Generally, you should have these blood sugar levels:  Before meals, or after not eating for a long time (fasting or preprandial): at or below 95 mg/dL (5.3 mmol/L).  After meals (postprandial): ? One hour after a meal: at or below 140 mg/dL (7.8 mmol/L). ? Two hours after a meal: at or below 120 mg/dL (6.7 mmol/L).  A1c (hemoglobin A1c) level: 6-6.5%. How to manage high and low blood sugar Signs of high blood sugar High blood sugar is called hyperglycemia. Know the early signs of high blood sugar. Signs may include:  Feeling: ? Thirsty. ? Hungry. ? Very tired.  Needing to pee (urinate) more than usual.  Blurry vision. Signs of low blood sugar Low blood sugar is called hypoglycemia. This is when blood sugar is at or below 70 mg/dL (3.9 mmol/L). Signs may include:  Feeling: ? Hungry. ? Worried or nervous (anxious). ? Sweaty and clammy. ? Confused. ? Dizzy. ? Sleepy. ? Sick to your stomach (nauseous).  Having: ? A fast heartbeat. ? A headache. ? A change  in your vision. ? Tingling or no feeling (numbness) around your mouth, lips, or tongue. ? Jerky movements that you cannot control (seizure).  Having trouble with: ? Moving (coordination). ? Sleeping. ? Passing out (fainting). ? Getting upset easily (irritability). Treating low blood sugar To treat low blood sugar, eat or drink something sugary right away. If you can think clearly and swallow safely, follow the 15:15 rule:  Take 15 grams of a fast-acting carb (carbohydrate). Talk with your doctor about how much you should take.  Some fast-acting carbs are: ? Sugar tablets (glucose pills). Take 3-4 glucose pills. ? 6-8 pieces of hard candy. ? 4-6 oz (120-150 mL) of fruit juice. ? 4-6 oz (120-150 mL) of regular (not diet) soda. ? 1 Tbsp (15 mL) honey or sugar.  Check your blood sugar 15 minutes after you take the carb.  If your blood sugar is still at or below 70 mg/dL (3.9 mmol/L), take 15 grams of a carb again.  If your blood sugar does not go above 70 mg/dL (3.9 mmol/L) after 3 tries, get help right away.  After your blood sugar goes back to normal, eat a meal or a snack within 1 hour. Treating very low blood sugar If your blood sugar is at or below 54 mg/dL (3 mmol/L), you have very low blood sugar (severe hypoglycemia). This is an emergency. Do not wait to see if the symptoms will go away. Get medical help right  away. Call your local emergency services (911 in the [place] If you have very low blood sugar and you cannot eat or drink, you may need a glucagon shot (injection). A family member or friend should learn how to check your blood sugar and how to give you a glucagon shot. Ask your doctor if you need to have a glucagon shot kit at home. Follow these instructions at home: Medicine  Take your insulin and diabetes medicines as told.  If your doctor says you should take more or less insulin or medicines, do this exactly as told.  Do not run out of insulin or  medicines. Food   Make healthy food choices. These include: ? Chicken, fish, egg whites, and beans. ? Oats, whole wheat, bulgur, [place] rice, quinoa, and millet. ? Fresh fruits and vegetables. ? Low-fat dairy products. ? Nuts, avocado, olive oil, and canola oil.  Meet with a food specialist (dietitian). He or she can help you make an eating plan that is right for you.  Follow instructions from your doctor about what you cannot eat or drink.  Drink enough fluid to keep your pee (urine) pale yellow.  Eat healthy snacks between healthy meals.  Keep track of carbs that you eat. Do this by reading food labels and learning food serving sizes.  Follow your sick day plan when you cannot eat or drink normally. Make this plan with your doctor so it is ready to use. Activity  Exercise for 30 or more minutes a day, or as much as your doctor recommends.  Talk with your doctor before you start a new exercise or activity. Your doctor may need to tell you to change: ? How much insulin or medicines you take. ? How much food you eat. Lifestyle  Do not drink alcohol.  Do not use any tobacco products. These include cigarettes, chewing tobacco, and e-cigarettes. If you need help quitting, ask your doctor.  Learn how to deal with stress. If you need help with this, ask your doctor. Body care  Stay up to date with your shots (immunizations).  Brush your teeth and gums two times a day. Floss one or more times a day.  Go to the dentist one or more times every 6 months.  Stay at a healthy weight while you are pregnant. General instructions  Take over-the-counter and prescription medicines only as told by your doctor.  Ask your doctor about risks of high blood pressure in pregnancy (preeclampsia and eclampsia).  Share your diabetes care plan with: ? Your work or school. ? People you live with.  Check your pee for ketones: ? When you are sick. ? As told by your doctor.  Carry a card or  wear jewelry that says you have diabetes.  Keep all follow-up visits as told by your doctor. This is important. Care after giving birth  Have your blood sugar checked 4-12 weeks after you give birth.  Get checked for diabetes one or more times during 3 years. Questions to ask your doctor  Do I need to meet with a diabetes educator?  Where can I find a support group for people with gestational diabetes? Where to find more information To learn more about diabetes, visit:  American Diabetes Association: www.diabetes.org  Centers for Disease Control and Prevention (CDC): www.cdc.gov Summary  Check your blood sugar (glucose) every day while you are pregnant. Check it as often as told.  Take your insulin and diabetes medicines as told.  Keep all follow-up visits as   told by your doctor. This is important.  Have your blood sugar checked 4-12 weeks after you give birth. This information is not intended to replace advice given to you by your health care provider. Make sure you discuss any questions you have with your health care provider. Document Released: 07/29/2015 Document Revised: 09/27/2017 Document Reviewed: 05/10/2015 Elsevier Patient Education  2020 [employer]vier Inc[employer]  

## 2019-04-19 ENCOUNTER — Encounter: Payer: Medicaid Other | Attending: Obstetrics & Gynecology | Admitting: Registered"

## 2019-04-19 ENCOUNTER — Other Ambulatory Visit: Payer: Self-pay

## 2019-04-19 ENCOUNTER — Encounter: Payer: Self-pay | Admitting: Registered"

## 2019-04-19 DIAGNOSIS — O99810 Abnormal glucose complicating pregnancy: Secondary | ICD-10-CM | POA: Diagnosis not present

## 2019-04-19 NOTE — Progress Notes (Signed)
Patient was seen on 04/19/2019 for Gestational Diabetes self-management class at the [facility]. The following learning objectives were met by the patient during this course:   States the definition of Gestational Diabetes  States why dietary management is important in controlling blood glucose  Describes the effects each nutrient has on blood glucose levels  Demonstrates ability to create a balanced meal plan  Demonstrates carbohydrate counting   States when to check blood glucose levels  Demonstrates proper blood glucose monitoring techniques  States the effect of stress and exercise on blood glucose levels  States the importance of limiting caffeine and abstaining from alcohol and smoking  Blood glucose monitor given: Accu-chek Guide Me Lot # [id number] Exp: 05/09/20 Blood glucose reading: 97 mg/dL  Patient instructed to monitor glucose levels: FBS: 60 - <95; 1 hour: <140; 2 hour: <120  Patient received handouts:  Nutrition Diabetes and Pregnancy, including carb counting list  Patient will be seen for follow-up as needed.

## 2019-04-21 NOTE — L&D Delivery Note (Signed)
Delivery Note At 2:36 PM a viable female was delivered via Vaginal, Spontaneous (Presentation: Left Occiput Anterior).  APGAR: 8, 9; weight pending.   Placenta status: Spontaneous, Intact.  Cord: 3 vessels with the following complications: None.  Anesthesia: Epidural Episiotomy: None Lacerations: 2nd degree Suture Repair: 3.0 vicryl Est. Blood Loss (mL):  550  Mom to postpartum.  Baby to Couplet care / Skin to Skin.  Rolm Bookbinder CNM 06/03/2019, 2:58 PM

## 2019-04-24 ENCOUNTER — Ambulatory Visit: Payer: Medicaid Other | Attending: Advanced Practice Midwife | Admitting: Physical Therapy

## 2019-04-24 DIAGNOSIS — M5441 Lumbago with sciatica, right side: Secondary | ICD-10-CM | POA: Insufficient documentation

## 2019-04-24 DIAGNOSIS — R293 Abnormal posture: Secondary | ICD-10-CM | POA: Insufficient documentation

## 2019-04-24 DIAGNOSIS — R29898 Other symptoms and signs involving the musculoskeletal system: Secondary | ICD-10-CM | POA: Insufficient documentation

## 2019-04-24 DIAGNOSIS — M6281 Muscle weakness (generalized): Secondary | ICD-10-CM | POA: Insufficient documentation

## 2019-04-26 ENCOUNTER — Ambulatory Visit: Payer: Medicaid Other | Admitting: Physical Therapy

## 2019-04-26 ENCOUNTER — Encounter: Payer: Self-pay | Admitting: Physical Therapy

## 2019-04-26 ENCOUNTER — Other Ambulatory Visit: Payer: Self-pay

## 2019-04-26 DIAGNOSIS — R293 Abnormal posture: Secondary | ICD-10-CM | POA: Diagnosis present

## 2019-04-26 DIAGNOSIS — M6281 Muscle weakness (generalized): Secondary | ICD-10-CM

## 2019-04-26 DIAGNOSIS — M5441 Lumbago with sciatica, right side: Secondary | ICD-10-CM

## 2019-04-26 DIAGNOSIS — R29898 Other symptoms and signs involving the musculoskeletal system: Secondary | ICD-10-CM

## 2019-04-26 NOTE — Therapy (Signed)
[facility] [address]  [place], [place], [address] Phone: [phone]   Fax:  [phone]  Physical Therapy Evaluation  Patient Details  Name: Tracey Dunn MRN: [id number] Date of Birth: 1996-03-11 Referring Provider (PT): Hansel Feinstein, CNM   Encounter Date: 04/26/2019  PT End of Session - 04/26/19 1224    Visit Number  1    Number of Visits  4    Date for PT Re-Evaluation  05/17/19    Authorization Type  Medicaid    PT Start Time  0849    PT Stop Time  0923    PT Time Calculation (min)  34 min    Activity Tolerance  Patient tolerated treatment well    Behavior During Therapy  [facility] for tasks assessed/performed       Past Medical History:  Diagnosis Date  . Anemia     Past Surgical History:  Procedure Laterality Date  . TONSILLECTOMY    . WISDOM TOOTH EXTRACTION      There were no vitals filed for this visit.   Subjective Assessment - 04/26/19 0850    Subjective  Patient reporting LBP for the past 2 months or so. At time of onset, she visited the ED for her "sciatica nerve". Reports that pain has had an insidious onset without accident or trauma. Pain is located along B sides of LB with radiation into B buttocks and R ankle. Reports N/T in buttocks with short durations of sitting or standing. When she stands up she feels an audible pop in her tailbone. Pain worse with sitting, standing, laying in any position. No known relieving factors. Patient is [redacted] weeks pregnant, with gestational DM. Has not taken her blood glucose today,    Pertinent History  current pregnancy in 3rd trimester, gestational DM, anemia    Limitations  Sitting;Lifting;Standing;Walking;House hold activities    How long can you sit comfortably?  20-30 min    How long can you stand comfortably?  15 min    How long can you walk comfortably?  variable    Diagnostic tests  none recent    Patient Stated Goals  "be able to sit down and lay  down"    Currently in Pain?  Yes    Pain Score  5     Pain Location  Buttocks    Pain Orientation  Right;Left;Lower    Pain Descriptors / Indicators  Aching    Pain Type  Acute pain         [facility] PT Assessment - 04/26/19 0859      Assessment   Medical Diagnosis  Back pain affecting pregnancy in 3rd trimester    Referring Provider (PT)  Hansel Feinstein, CNM    Onset Date/Surgical Date  02/24/19    Next MD Visit  not scheduled    Prior Therapy  no      Precautions   Precautions  --   3rd trimester of pregnancy     Balance Screen   Has the patient fallen in the past 6 months  No    Has the patient had a decrease in activity level because of a fear of falling?   No    Is the patient reluctant to leave their home because of a fear of falling?   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    Leisure  none      Cognition   Overall Cognitive Status  Within Functional Limits for tasks assessed      Sensation   Light Touch  Appears Intact      Coordination   Gross Motor Movements are Fluid and Coordinated  Yes      Posture/Postural Control   Posture/Postural Control  Postural limitations    Postural Limitations  Increased lumbar lordosis;Rounded Shoulders      ROM / Strength   AROM / PROM / Strength  AROM;Strength      AROM   AROM Assessment Site  Lumbar    Lumbar Flexion  toes   mild L calf pain   Lumbar Extension  WFL    Lumbar - Right Side Bend  proximal shin    Lumbar - Left Side Bend  proximal shin    Lumbar - Right Rotation  WNL     Lumbar - Left Rotation  WNL      Strength   Strength Assessment Site  Hip;Knee;Ankle    Right/Left Hip  Right;Left    Right Hip Flexion  4+/5    Right Hip ABduction  4+/5    Right Hip ADduction  4/5    Left Hip Flexion  4+/5    Left Hip ABduction  4+/5    Left Hip ADduction  4/5    Right/Left Knee  Right;Left    Right Knee Flexion  4/5    Right Knee Extension  4/5    Left Knee Flexion  4-/5    Left Knee Extension  4/5    Right/Left Ankle  Right;Left    Right Ankle Dorsiflexion  4/5    Right Ankle Plantar Flexion  4/5    Left Ankle Dorsiflexion  4+/5    Left Ankle Plantar Flexion  4+/5      Flexibility   Soft Tissue Assessment /Muscle Length  yes    Quadriceps  B hip flexors severely tight      Palpation   SI assessment   B PSIS and ASIS level    Palpation comment  TTP with gentle palpation over L1-coccyx spinous processes; B piriformis, B PSIS, and B ASIS      Ambulation/Gait   Assistive device  None    Gait Pattern  Step-through pattern;Lateral hip instability    Ambulation Surface  Level;Indoor    Gait velocity  WFL                Objective measurements completed on examination: See above findings.              PT Education - 04/26/19 0926    Education Details  prognosis, POC, HEP; edu on use of sacral belt for pain relief    Person(s) Educated  Patient    Methods  Explanation;Demonstration;Tactile cues;Verbal cues;Handout    Comprehension  Verbalized understanding;Returned demonstration       PT Short Term Goals - 04/26/19 1304      PT SHORT TERM GOAL #  1   Title  Patient to be independent with initial HEP.    Time  1    Period  Weeks    Status  New    Target Date  05/03/19        PT Long Term Goals - 04/26/19 1304      PT LONG TERM GOAL #1   Title  Patient to be independent with advanced HEP.    Time  3    Period  Weeks    Status  New    Target Date  05/17/19      PT LONG TERM GOAL #2   Title  Patient to demonstrate B LE  strength >/=4+/5.    Time  3    Period  Weeks    Status  New    Target Date  05/17/19      PT LONG TERM GOAL #3   Title  Patient to report and demonstrate use of proper posture and body mechanics with ADLs to decrease strain on LB.    Time  3    Period  Weeks    Status  New    Target Date  05/17/19      PT LONG TERM GOAL #4   Title  Patient to report tolerance of 1 hour of sitting without pain in order to improve ability to tolerate work.    Time  3    Period  Weeks    Status  New    Target Date  05/17/19             Plan - 04/26/19 1225    Clinical Impression Statement  Patient is a 23y/o F, [redacted] weeks pregnant, presenting to OPPT with c/o insidious B LBP of 2 months duration. Pain is located along B sides of LB with radiation into B buttocks and R ankle. N/T occurs in B buttocks with short durations of sitting or standing. Also notes an audible pop in her tailbone with STS transitions. Pain is worse with sitting, standing, laying in any position. Patient today presenting with quite lordotic posture, good lumbar AROM with minor pain, B LE weakness, tightness in B hip flexors, and tenderness along spinous processes of L1 down to coccyx, B piriformis, B PSIS, and B ASIS. Sacral alignment normal. Educated patient on gentle stretching and lumbopelvic ROM HEP- patient reported understanding. Would benefit form skilled PT services 1x/week for 3 weeks to address aforementioned impairments.    Personal Factors and Comorbidities  Comorbidity 2;Fitness;Past/Current Experience;Profession;Time since onset of injury/illness/exacerbation;Sex    Comorbidities  gestational DM, anemia    Examination-Activity Limitations  Bathing;Sit;Bed Mobility;Sleep;Bend;Squat;Stairs;Caring for Others;Carry;Stand;Dressing;Transfers;Hygiene/Grooming;Lift;Locomotion Level    Examination-Participation Restrictions  Church;Cleaning;Shop;Driving;Yard Work;Interpersonal Relationship;Laundry;Meal Prep     Stability/Clinical Decision Making  Stable/Uncomplicated    Clinical Decision Making  Low    Rehab Potential  Good    PT Frequency  1x / week    PT Duration  3 weeks    PT Treatment/Interventions  ADLs/Self Care Home Management;Cryotherapy;Moist Heat;Balance training;Therapeutic exercise;Therapeutic activities;Functional mobility training;Stair training;Gait training;Neuromuscular re-education;Patient/family education;Manual techniques;Taping;Energy conservation;Dry needling;Passive range of motion    PT Next Visit Plan  reassess HEP    Consulted and Agree with Plan of Care  Patient       Patient will benefit from skilled therapeutic intervention in order to improve the following deficits and impairments:  Decreased activity tolerance, Decreased strength, Pain, Increased muscle spasms, Improper body mechanics, Decreased range of motion, Impaired flexibility, Postural dysfunction  Visit Diagnosis: Acute bilateral  low back pain with right-sided sciatica  Muscle weakness (generalized)  Other symptoms and signs involving the musculoskeletal system  Abnormal posture     Problem List Patient Active Problem List   Diagnosis Date Noted  . Gestational diabetes mellitus (GDM) 04/03/2019  . Alpha thalassemia silent carrier 02/07/2019  . 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 Supervision of normal first pregnancy, antepartum 01/06/2019  . Obesity affecting pregnancy, antepartum 01/06/2019  . Overweight 09/23/2017  . OSA (obstructive sleep apnea) 09/10/2016  . Snoring 09/10/2016  . Anemia 04/01/2015  . Menorrhagia 04/01/2015  . Obesity due to excess calories 04/01/2015     Anette Guarneri, PT, DPT 04/26/19 1:10 PM   [facility] [address]  [address] [place], [place], [address] Phone: [phone]   Fax:  [phone]  Name: Tracey Dunn MRN: [id number] Date of Birth: 15-Jun-1995

## 2019-04-27 ENCOUNTER — Ambulatory Visit (INDEPENDENT_AMBULATORY_CARE_PROVIDER_SITE_OTHER): Payer: Medicaid Other | Admitting: Family Medicine

## 2019-04-27 VITALS — BP 130/77 | HR 98

## 2019-04-27 DIAGNOSIS — Z3400 Encounter for supervision of normal first pregnancy, unspecified trimester: Secondary | ICD-10-CM

## 2019-04-27 DIAGNOSIS — O99891 Other specified diseases and conditions complicating pregnancy: Secondary | ICD-10-CM

## 2019-04-27 DIAGNOSIS — M549 Dorsalgia, unspecified: Secondary | ICD-10-CM

## 2019-04-27 DIAGNOSIS — O99210 Obesity complicating pregnancy, unspecified trimester: Secondary | ICD-10-CM

## 2019-04-27 DIAGNOSIS — O24419 Gestational diabetes mellitus in pregnancy, unspecified control: Secondary | ICD-10-CM

## 2019-04-27 DIAGNOSIS — Z3A31 31 weeks gestation of pregnancy: Secondary | ICD-10-CM

## 2019-04-27 DIAGNOSIS — O99213 Obesity complicating pregnancy, third trimester: Secondary | ICD-10-CM

## 2019-04-27 MED ORDER — ACCU-CHEK GUIDE VI STRP: ORAL_STRIP | 12 refills | Status: DC

## 2019-04-27 NOTE — Progress Notes (Signed)
Subjective:  Tracey Dunn is a 24 y.o. G1P0000 at [redacted]w[redacted]d being seen today for ongoing prenatal care.  She is currently monitored for the following issues for this high-risk pregnancy and has Anemia; Menorrhagia; Obesity due to excess calories; Supervision of normal first pregnancy, antepartum; Obesity affecting pregnancy, antepartum; LGSIL on Pap smear of cervix; OSA (obstructive sleep apnea); Overweight; Snoring; Alpha thalassemia silent carrier; and Gestational diabetes mellitus (GDM) on their problem list.  GDM: Patient diet controlled Fasting: mostly controlled 2hr PP: controlled  Patient reports backache.  Contractions: Not present. Vag. Bleeding: None.  Movement: Present. Denies leaking of fluid.   The following portions of the patient's history were reviewed and updated as appropriate: allergies, current medications, past family history, past medical history, past social history, past surgical history and problem list. Problem list updated.  Objective:   Vitals:   04/27/19 1503  BP: 130/77  Pulse: 98    Fetal Status: Fetal Heart Rate (bpm): 155   Movement: Present     General:  Alert, oriented and cooperative. Patient is in no acute distress.  Skin: Skin is warm and dry. No rash noted.   Cardiovascular: Normal heart rate noted  Respiratory: Normal respiratory effort, no problems with respiration noted  Abdomen: Soft, gravid, appropriate for gestational age. Pain/Pressure: Present     Pelvic: Vag. Bleeding: None     Cervical exam deferred        Extremities: Normal range of motion.  Edema: None  Mental Status: Normal mood and affect. Normal behavior. Normal judgment and thought content.   Urinalysis:      Assessment and Plan:  Pregnancy: G1P0000 at [redacted]w[redacted]d  1. Supervision of normal first pregnancy, antepartum FHT and FH normal  2. Gestational diabetes mellitus (GDM) in third trimester, gestational diabetes method of control unspecified Continue diet control  3. Obesity  affecting pregnancy, antepartum  4. Back pain affecting pregnancy in third trimester Seeing PT.  Preterm labor symptoms and general obstetric precautions including but not limited to vaginal bleeding, contractions, leaking of fluid and fetal movement were reviewed in detail with the patient. Please refer to After Visit Summary for other counseling recommendations.  Return in about 2 weeks (around 05/11/2019) for In Office, OB f/u.   Levie Heritage, DO

## 2019-05-03 ENCOUNTER — Other Ambulatory Visit: Payer: Self-pay

## 2019-05-03 ENCOUNTER — Ambulatory Visit: Payer: Medicaid Other

## 2019-05-03 VITALS — BP 138/78 | HR 78

## 2019-05-03 DIAGNOSIS — R293 Abnormal posture: Secondary | ICD-10-CM

## 2019-05-03 DIAGNOSIS — R29898 Other symptoms and signs involving the musculoskeletal system: Secondary | ICD-10-CM

## 2019-05-03 DIAGNOSIS — M6281 Muscle weakness (generalized): Secondary | ICD-10-CM

## 2019-05-03 DIAGNOSIS — M5441 Lumbago with sciatica, right side: Secondary | ICD-10-CM | POA: Diagnosis not present

## 2019-05-03 NOTE — Therapy (Signed)
[facility] [address]  [place], [place], [address] Phone: [phone]   Fax:  [phone]  Physical Therapy Treatment  Patient Details  Name: Tracey Dunn MRN: [id number] Date of Birth: 09/25/1995 Referring Provider (PT): Hansel Feinstein, CNM [place] on 05/11/19 app)   Encounter Date: 05/03/2019  PT End of Session - 05/03/19 0905    Visit Number  2    Number of Visits  4    Date for PT Re-Evaluation  05/17/19    Authorization Type  Medicaid    Authorization Time Period  05/03/19 - 05/23/19    Authorization - Visit Number  1    Authorization - Number of Visits  3    PT Start Time  0488    PT Stop Time  0933    PT Time Calculation (min)  38 min    Activity Tolerance  Patient tolerated treatment well    Behavior During Therapy  [facility] for tasks assessed/performed       Past Medical History:  Diagnosis Date  . Anemia     Past Surgical History:  Procedure Laterality Date  . TONSILLECTOMY    . WISDOM TOOTH EXTRACTION      Vitals:   05/03/19 0903  BP: 138/78  Pulse: 78  SpO2: 97%    Subjective Assessment - 05/03/19 0903    Subjective  Pt. reporting she has not been having intense pain since last time she was in for PT.    Pertinent History  current pregnancy in 3rd trimester, gestational DM, anemia    Diagnostic tests  none recent    Patient Stated Goals  "be able to sit down and lay down"    Currently in Pain?  No/denies    Pain Score  0-No pain    Pain Location  Buttocks    Pain Orientation  Right;Left;Lower    Pain Descriptors / Indicators  Aching    Pain Type  Acute pain    Multiple Pain Sites  No         [facility] PT Assessment - 05/03/19 0001      Assessment   Medical Diagnosis  Back pain affecting pregnancy in 3rd trimester    Referring Provider (PT)  Hansel Feinstein, CNM   [place] on 05/11/19 app   Onset Date/Surgical Date  02/24/19    Next MD Visit  05/11/19    Prior Therapy  no                    [facility] Adult PT Treatment/Exercise - 05/03/19 0001      Self-Care   Self-Care  Other Self-Care Comments    Other Self-Care Comments   Self tennis ball release on wall to B glutes/piriformis; instruction on proper sitting desk posture      Lumbar Exercises: Stretches   Hip Flexor Stretch  Right;Left;2 reps;30 seconds    Hip Flexor Stretch Limitations  sitting on chair on half-lunge position    Figure 4 Stretch  2 reps;30 seconds    Figure 4 Stretch Limitations  seated with forward trunk lean      Lumbar Exercises: Aerobic   Nustep  Lvl 2, 7 min (LE only)      Lumbar Exercises: Seated   Sit to Stand  5 reps    Sit to Stand Limitations  2 sets; 2nd set with B hip abd/ER isometrics into red TB at knees       Knee/Hip  Exercises: Seated   Clamshell with [employer]  Red   Alternating red TB in sitting on chair x 10 reps    Other Seated Knee/Hip Exercises  Seated Pelvic tilt 3" x 10 reps     Hamstring Curl  Left;10 reps;Strengthening    Hamstring Limitations  green TB      Modalities   Modalities  Moist Heat      Moist Heat Therapy   Number Minutes Moist Heat  10 Minutes    Moist Heat Location  Hip   sitting in chair on heat pads              PT Short Term Goals - 05/03/19 0907      PT SHORT TERM GOAL #1   Title  Patient to be independent with initial HEP.    Time  1    Period  Weeks    Status  On-going    Target Date  05/03/19        PT Long Term Goals - 05/03/19 0907      PT LONG TERM GOAL #1   Title  Patient to be independent with advanced HEP.    Time  3    Period  Weeks    Status  On-going      PT LONG TERM GOAL #2   Title  Patient to demonstrate B LE strength >/=4+/5.    Time  3    Period  Weeks    Status  On-going      PT LONG TERM GOAL #3   Title  Patient to report and demonstrate use of proper posture and body mechanics with ADLs to decrease strain on LB.    Time  3    Period  Weeks    Status  On-going      PT  LONG TERM GOAL #4   Title  Patient to report tolerance of 1 hour of sitting without pain in order to improve ability to tolerate work.    Time  3    Period  Weeks    Status  On-going            Plan - 05/03/19 0908    Clinical Impression Statement  Pt. doing well.  Reports she has been performing HEP with some relief from pain levels.  Notes significant pain relief over this past week.  Able to demo most HEP activities today with good technique only requiring cueing with sitting hip flexor stretch.  Tolerated proximal hip strengthening and LE stretching well without additional pain.  Did not tightness/tenderness in buttocks with review of tennis ball self-massage to B glutes thus ended visit with moist heat applied to buttocks to promote muscular relaxation.    Comorbidities  gestational DM, anemia    Rehab Potential  Good    PT Treatment/Interventions  ADLs/Self Care Home Management;Cryotherapy;Moist Heat;Balance training;Therapeutic exercise;Therapeutic activities;Functional mobility training;Stair training;Gait training;Neuromuscular re-education;Patient/family education;Manual techniques;Taping;Energy conservation;Dry needling;Passive range of motion    Consulted and Agree with Plan of Care  Patient       Patient will benefit from skilled therapeutic intervention in order to improve the following deficits and impairments:  Decreased activity tolerance, Decreased strength, Pain, Increased muscle spasms, Improper body mechanics, Decreased range of motion, Impaired flexibility, Postural dysfunction  Visit Diagnosis: Acute bilateral low back pain with right-sided sciatica  Muscle weakness (generalized)  Other symptoms and signs involving the musculoskeletal system  Abnormal posture     Problem List Patient Active Problem List  Diagnosis Date Noted  . Gestational diabetes mellitus (GDM) 04/03/2019  . Alpha thalassemia silent carrier 02/07/2019  . LGSIL on Pap smear of cervix  01/27/2019  . Supervision of normal first pregnancy, antepartum 01/06/2019  . Obesity affecting pregnancy, antepartum 01/06/2019  . Overweight 09/23/2017  . OSA (obstructive sleep apnea) 09/10/2016  . Snoring 09/10/2016  . Anemia 04/01/2015  . Menorrhagia 04/01/2015  . Obesity due to excess calories 04/01/2015    Kermit Balo, PTA 05/03/19 2:52 PM   [facility] [address]  [address] [place], [place], [address] Phone: [phone]   Fax:  [phone]  Name: Junnie Loschiavo MRN: [id number] Date of Birth: [birth year]

## 2019-05-04 ENCOUNTER — Other Ambulatory Visit: Payer: Self-pay

## 2019-05-04 DIAGNOSIS — O24919 Unspecified diabetes mellitus in pregnancy, unspecified trimester: Secondary | ICD-10-CM

## 2019-05-04 MED ORDER — ACCU-CHEK FASTCLIX LANCETS MISC: 1.0000 | Freq: Four times a day (QID) | 12 refills | Status: DC

## 2019-05-05 ENCOUNTER — Other Ambulatory Visit: Payer: Self-pay

## 2019-05-05 ENCOUNTER — Encounter (HOSPITAL_COMMUNITY): Payer: Self-pay

## 2019-05-05 ENCOUNTER — Ambulatory Visit (HOSPITAL_COMMUNITY)
Admission: RE | Admit: 2019-05-05 | Discharge: 2019-05-05 | Disposition: A | Discharge status: Home / Self Care | Payer: Medicaid Other | Source: Ambulatory Visit | Attending: Obstetrics and Gynecology | Admitting: Obstetrics and Gynecology

## 2019-05-05 ENCOUNTER — Other Ambulatory Visit (HOSPITAL_COMMUNITY): Payer: Self-pay | Admitting: *Deleted

## 2019-05-05 ENCOUNTER — Ambulatory Visit (HOSPITAL_COMMUNITY): Payer: Medicaid Other | Admitting: *Deleted

## 2019-05-05 DIAGNOSIS — O24419 Gestational diabetes mellitus in pregnancy, unspecified control: Secondary | ICD-10-CM

## 2019-05-05 DIAGNOSIS — Z362 Encounter for other antenatal screening follow-up: Secondary | ICD-10-CM | POA: Diagnosis present

## 2019-05-05 DIAGNOSIS — O99013 Anemia complicating pregnancy, third trimester: Secondary | ICD-10-CM | POA: Diagnosis not present

## 2019-05-05 DIAGNOSIS — R87612 Low grade squamous intraepithelial lesion on cytologic smear of cervix (LGSIL): Secondary | ICD-10-CM | POA: Diagnosis present

## 2019-05-05 DIAGNOSIS — O99213 Obesity complicating pregnancy, third trimester: Secondary | ICD-10-CM

## 2019-05-05 DIAGNOSIS — O24410 Gestational diabetes mellitus in pregnancy, diet controlled: Secondary | ICD-10-CM | POA: Diagnosis not present

## 2019-05-05 DIAGNOSIS — Z3400 Encounter for supervision of normal first pregnancy, unspecified trimester: Secondary | ICD-10-CM | POA: Diagnosis present

## 2019-05-05 DIAGNOSIS — Z3A33 33 weeks gestation of pregnancy: Secondary | ICD-10-CM

## 2019-05-10 ENCOUNTER — Other Ambulatory Visit: Payer: Self-pay

## 2019-05-10 ENCOUNTER — Ambulatory Visit: Payer: Medicaid Other

## 2019-05-10 DIAGNOSIS — M5441 Lumbago with sciatica, right side: Secondary | ICD-10-CM | POA: Diagnosis not present

## 2019-05-10 DIAGNOSIS — R293 Abnormal posture: Secondary | ICD-10-CM

## 2019-05-10 DIAGNOSIS — R29898 Other symptoms and signs involving the musculoskeletal system: Secondary | ICD-10-CM

## 2019-05-10 DIAGNOSIS — M6281 Muscle weakness (generalized): Secondary | ICD-10-CM

## 2019-05-10 NOTE — Therapy (Signed)
[facility] [address]  [place], [place], [address] Phone: [phone]   Fax:  [phone]  Physical Therapy Treatment  Patient Details  Name: Tracey Dunn MRN: [id number] Date of Birth: 04-Jun-1995 Referring Provider (PT): Hansel Feinstein, CNM [place] on 05/11/19 app)   Encounter Date: 05/10/2019  PT End of Session - 05/10/19 0852    Visit Number  3    Number of Visits  4    Date for PT Re-Evaluation  05/17/19    Authorization Type  Medicaid    Authorization Time Period  05/03/19 - 05/23/19    Authorization - Visit Number  2    Authorization - Number of Visits  3    PT Start Time  0846    PT Stop Time  0940   Ended visit with 10 min moist heat   PT Time Calculation (min)  54 min    Activity Tolerance  Patient tolerated treatment well    Behavior During Therapy  [facility] for tasks assessed/performed       Past Medical History:  Diagnosis Date  . Anemia     Past Surgical History:  Procedure Laterality Date  . TONSILLECTOMY    . WISDOM TOOTH EXTRACTION      There were no vitals filed for this visit.  Subjective Assessment - 05/10/19 0850    Subjective  Pt. reporting significant increase in her lower back pain and buttocks pain yesterday without known trigger which bothered her most when sitting.    Pertinent History  current pregnancy in 3rd trimester, gestational DM, anemia    Diagnostic tests  none recent    Patient Stated Goals  "be able to sit down and lay down"    Currently in Pain?  No/denies    Pain Score  0-No pain   0/10 currently,  10/10 at worst with sitting yesterday   Pain Location  Buttocks    Pain Orientation  Right;Left;Lower    Pain Descriptors / Indicators  Aching    Pain Type  Acute pain    Multiple Pain Sites  No                       [facility] Adult PT Treatment/Exercise - 05/10/19 0001      Lumbar Exercises: Stretches   Passive Hamstring Stretch  Right;Left;1  rep;20 seconds    Passive Hamstring Stretch Limitations  supine + strap     Figure 4 Stretch  2 reps;30 seconds    Figure 4 Stretch Limitations  seated with forward trunk lean      Lumbar Exercises: Aerobic   Nustep  Lvl 3, 7 min (LE only)      Lumbar Exercises: Supine   Clam  10 reps;3 seconds    Clam Limitations  alternating red TB at knees     Bridge with [employer]  10 reps;3 seconds    Bridge with [employer] Limitations  + adduction ball squeeze       Modalities   Modalities  Moist Heat      Moist Heat Therapy   Number Minutes Moist Heat  10 Minutes    Moist Heat Location  Hip   L buttocks      Manual Therapy   Manual Therapy  Soft tissue mobilization;Myofascial release    Manual therapy comments  B sidelying with LE resting on bolster     Soft tissue mobilization  B buttocks, piriformis L  more tender>R    Myofascial Release  TPR to B piriformis                PT Short Term Goals - 05/10/19 0909      PT SHORT TERM GOAL #1   Title  Patient to be independent with initial HEP.    Time  1    Period  Weeks    Status  Achieved    Target Date  05/03/19        PT Long Term Goals - 05/03/19 0907      PT LONG TERM GOAL #1   Title  Patient to be independent with advanced HEP.    Time  3    Period  Weeks    Status  On-going      PT LONG TERM GOAL #2   Title  Patient to demonstrate B LE strength >/=4+/5.    Time  3    Period  Weeks    Status  On-going      PT LONG TERM GOAL #3   Title  Patient to report and demonstrate use of proper posture and body mechanics with ADLs to decrease strain on LB.    Time  3    Period  Weeks    Status  On-going      PT LONG TERM GOAL #4   Title  Patient to report tolerance of 1 hour of sitting without pain in order to improve ability to tolerate work.    Time  3    Period  Weeks    Status  On-going            Plan - 05/10/19 0900    Clinical Impression Statement  Pt. reporting increased B buttocks pain  yesterday without known trigger.  Notes she did have to take a break after 5 min of, "cleaning out car" on Saturday after onset of back pain.  Pt. noting pain levels improved somewhat today and MT addressing increased buttocks/piriformis tension/tightness L>R today with some improved tissues quality noted following this.  Pt. further instructed to use tennis ball on wall at home for self-massage to buttocks musculature to improved sitting tolerance.  Pt. verbalized understanding.  Progressed glute focused lumbopelvic strengthening in supine today without issue and Ended visit with application of moist heat pack to L buttocks in sidelying to promote muscular relaxation.  Pt. leaving session noting reduction in pain levels.    Comorbidities  gestational DM, anemia    PT Treatment/Interventions  ADLs/Self Care Home Management;Cryotherapy;Moist Heat;Balance training;Therapeutic exercise;Therapeutic activities;Functional mobility training;Stair training;Gait training;Neuromuscular re-education;Patient/family education;Manual techniques;Taping;Energy conservation;Dry needling;Passive range of motion    Consulted and Agree with Plan of Care  Patient       Patient will benefit from skilled therapeutic intervention in order to improve the following deficits and impairments:  Decreased activity tolerance, Decreased strength, Pain, Increased muscle spasms, Improper body mechanics, Decreased range of motion, Impaired flexibility, Postural dysfunction  Visit Diagnosis: Acute bilateral low back pain with right-sided sciatica  Muscle weakness (generalized)  Other symptoms and signs involving the musculoskeletal system  Abnormal posture     Problem List Patient Active Problem List   Diagnosis Date Noted  . Gestational diabetes mellitus (GDM) 04/03/2019  . Alpha thalassemia silent carrier 02/07/2019  . 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 Supervision of normal first pregnancy, antepartum 01/06/2019   . Obesity affecting pregnancy, antepartum 01/06/2019  . Overweight 09/23/2017  . OSA (obstructive sleep apnea) 09/10/2016  .  Snoring 09/10/2016  . Anemia 04/01/2015  . Menorrhagia 04/01/2015  . Obesity due to excess calories 04/01/2015    Kermit Balo, PTA 05/10/19 10:56 AM   [facility] [address]  [address] [place], [place], [address] Phone: [phone]   Fax:  [phone]  Name: Tracey Dunn MRN: [id number] Date of Birth: 01/09/1996

## 2019-05-11 ENCOUNTER — Encounter: Payer: Medicaid Other | Admitting: Family Medicine

## 2019-05-17 ENCOUNTER — Ambulatory Visit: Payer: Medicaid Other | Admitting: Physical Therapy

## 2019-05-23 ENCOUNTER — Ambulatory Visit: Payer: Medicaid Other | Attending: Advanced Practice Midwife | Admitting: Physical Therapy

## 2019-05-23 ENCOUNTER — Other Ambulatory Visit: Payer: Self-pay

## 2019-05-23 ENCOUNTER — Encounter: Payer: Self-pay | Admitting: Physical Therapy

## 2019-05-23 VITALS — BP 120/68 | HR 91

## 2019-05-23 DIAGNOSIS — M6281 Muscle weakness (generalized): Secondary | ICD-10-CM | POA: Insufficient documentation

## 2019-05-23 DIAGNOSIS — R293 Abnormal posture: Secondary | ICD-10-CM | POA: Insufficient documentation

## 2019-05-23 DIAGNOSIS — R29898 Other symptoms and signs involving the musculoskeletal system: Secondary | ICD-10-CM | POA: Diagnosis present

## 2019-05-23 DIAGNOSIS — M5441 Lumbago with sciatica, right side: Secondary | ICD-10-CM | POA: Insufficient documentation

## 2019-05-23 NOTE — Therapy (Signed)
[facility] [address]  [place], [place], [address] Phone: [phone]   Fax:  [phone]  Physical Therapy Treatment  Patient Details  Name: Tracey Dunn MRN: [id number] Date of Birth: 10/14/1995 Referring Provider (PT): Hansel Feinstein, CNM   Encounter Date: 05/23/2019  PT End of Session - 05/23/19 1238    Visit Number  4    Number of Visits  16    Date for PT Re-Evaluation  07/04/19    Authorization Type  Medicaid    Authorization Time Period  05/03/19 - 05/23/19    Authorization - Visit Number  3    Authorization - Number of Visits  3    PT Start Time  0851    PT Stop Time  0931    PT Time Calculation (min)  40 min    Activity Tolerance  Patient tolerated treatment well;Patient limited by pain    Behavior During Therapy  [facility] for tasks assessed/performed       Past Medical History:  Diagnosis Date  . Anemia     Past Surgical History:  Procedure Laterality Date  . TONSILLECTOMY    . WISDOM TOOTH EXTRACTION      Vitals:   05/23/19 0853  BP: 120/68  Pulse: 91  SpO2: 96%    Subjective Assessment - 05/23/19 0852    Subjective  Feeling tired. Feels that her back has been fine for the most pain, but is having tailbone pain mostly. Pain is worst in sitting. Reports 50% improvement in LBP.    Pertinent History  current pregnancy in 3rd trimester, gestational DM, anemia    Diagnostic tests  none recent    Patient Stated Goals  "be able to sit down and lay down"    Currently in Pain?  No/denies         [facility] PT Assessment - 05/23/19 0001      Assessment   Medical Diagnosis  Back pain affecting pregnancy in 3rd trimester    Referring Provider (PT)  Hansel Feinstein, CNM    Onset Date/Surgical Date  02/24/19      Strength   Right Hip Flexion  4+/5    Right Hip ABduction  4+/5    Right Hip ADduction  4/5    Left Hip Flexion  4+/5    Left Hip ABduction  4+/5    Left Hip ADduction  4/5    Right Knee  Flexion  4+/5    Right Knee Extension  4+/5    Left Knee Flexion  4/5    Left Knee Extension  4/5    Right Ankle Dorsiflexion  4+/5    Right Ankle Plantar Flexion  4+/5    Left Ankle Dorsiflexion  4/5    Left Ankle Plantar Flexion  4/5      Palpation   SI assessment   B PSIS and ASIS level                   [facility] Adult PT Treatment/Exercise - 05/23/19 0001      Exercises   Exercises  Lumbar;Knee/Hip      Lumbar Exercises: Aerobic   Nustep  Lvl 3, 6 min (LE only)      Lumbar Exercises: Supine   Bridge with Ball Squeeze  10 reps;3 seconds    Bridge with [employer] Limitations  + adduction ball squeeze    discontonued early d/t B anterior hip pain     Knee/Hip  Exercises: Seated   Ball Squeeze  10x5"      Knee/Hip Exercises: Sidelying   Hip ABduction  Strengthening;Right;Left;1 set;10 reps    Hip ABduction Limitations  cues for form    Hip ADduction  Strengthening;Right;1 set    Hip ADduction Limitations  attempted with leg propped on bolster- discontinued d/t buttock pian      Manual Therapy   Manual Therapy  Soft tissue mobilization;Myofascial release    Manual therapy comments  L sidelying     Soft tissue mobilization  STM to R proximal and lateral glutes, piriformis- most tenderness in piriformis and lateral glutes    Myofascial Release  manual TPR to R lateral glutes and piriformis             PT Education - 05/23/19 1238    Education Details  discussion on objective progress and remaining impairments    Person(s) Educated  Patient    Methods  Explanation;Demonstration    Comprehension  Verbalized understanding       PT Short Term Goals - 05/23/19 0901      PT SHORT TERM GOAL #1   Title  Patient to be independent with initial HEP.    Time  1    Period  Weeks    Status  Achieved    Target Date  05/03/19        PT Long Term Goals - 05/23/19 0901      PT LONG TERM GOAL #1   Title  Patient to be independent with advanced HEP.    Time   6    Period  Weeks    Status  Partially Met   met for current   Target Date  07/04/19      PT LONG TERM GOAL #2   Title  Patient to demonstrate B LE strength >/=4+/5.    Time  6    Period  Weeks    Status  Partially Met   improved B knee flexion, R knee extension, and R ankle dorsiflexion/plantarflexion strength   Target Date  07/04/19      PT LONG TERM GOAL #3   Title  Patient to report and demonstrate use of proper posture and body mechanics with ADLs to decrease strain on LB.    Time  6    Period  Weeks    Status  On-going   unable to sit in a chair for work d/t pain, opting to sit in bed   Target Date  07/04/19      PT LONG TERM GOAL #4   Title  Patient to report tolerance of 1 hour of sitting without pain in order to improve ability to tolerate work.    Time  6    Period  Weeks    Status  On-going   reports 5-10 min   Target Date  07/04/19            Plan - 05/23/19 1241    Clinical Impression Statement  Patient reporting 50% improvement in LB pain, but with coccyx pain remaining. Worst with sitting. Reporting consistency with HEP and denies having any questions. Strength testing today revealed improvement B knee flexion, R knee extension, and R ankle dorsiflexion/plantarflexion strength. Patient still most limited in B hip adduction and L knee and ankle strength. Patient notes that despite trying to adjust her working-from-home desk set up, she opts for sitting on her bed as this decreases her pain. Notes that at this time she is limited  to 5-10 minutes of sitting before onset of coccyx pain, and 10-15 minutes of standing before onset of standing pain. Worked on trying LE strengthening ther-ex is different positions to maximize patient's comfort, however patient with very limited positional tolerance. Ended session with STM to R buttock for pain relief- patient with tenderness over lateral glutes and piriformis. Patient without complaints at end of session. Patient is  showing slow progress towards goals, with progress being limited d/t progressive nature of pregnancy. Would benefit from additional skilled PT services 2x/week for 6 weeks to address remaining goals and return to PLOF.    Comorbidities  gestational DM, anemia    PT Frequency  2x / week    PT Duration  6 weeks    PT Treatment/Interventions  ADLs/Self Care Home Management;Cryotherapy;Moist Heat;Balance training;Therapeutic exercise;Therapeutic activities;Functional mobility training;Stair training;Gait training;Neuromuscular re-education;Patient/family education;Manual techniques;Taping;Energy conservation;Dry needling;Passive range of motion    PT Next Visit Plan  STM, hip stretching, gentle hip strengthening to tolerance    Consulted and Agree with Plan of Care  Patient       Patient will benefit from skilled therapeutic intervention in order to improve the following deficits and impairments:  Decreased activity tolerance, Decreased strength, Pain, Increased muscle spasms, Improper body mechanics, Decreased range of motion, Impaired flexibility, Postural dysfunction  Visit Diagnosis: Acute bilateral low back pain with right-sided sciatica  Muscle weakness (generalized)  Other symptoms and signs involving the musculoskeletal system  Abnormal posture     Problem List Patient Active Problem List   Diagnosis Date Noted  . Gestational diabetes mellitus (GDM) 04/03/2019  . Alpha thalassemia silent carrier 02/07/2019  . 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 Supervision of normal first pregnancy, antepartum 01/06/2019  . Obesity affecting pregnancy, antepartum 01/06/2019  . Overweight 09/23/2017  . OSA (obstructive sleep apnea) 09/10/2016  . Snoring 09/10/2016  . Anemia 04/01/2015  . Menorrhagia 04/01/2015  . Obesity due to excess calories 04/01/2015     Janene Harvey, PT, DPT 05/23/19 12:50 PM   [facility] [address]  [place], [place], [address] Phone: [phone]   Fax:  [phone]  Name: Tracey Dunn MRN: [id number] Date of Birth: 1995-12-02

## 2019-05-24 ENCOUNTER — Ambulatory Visit: Payer: Medicaid Other | Admitting: Physical Therapy

## 2019-05-25 ENCOUNTER — Ambulatory Visit (INDEPENDENT_AMBULATORY_CARE_PROVIDER_SITE_OTHER): Payer: Medicaid Other | Admitting: Family Medicine

## 2019-05-25 ENCOUNTER — Other Ambulatory Visit (HOSPITAL_COMMUNITY)
Admission: RE | Admit: 2019-05-25 | Discharge: 2019-05-25 | Disposition: A | Discharge status: Home / Self Care | Payer: Medicaid Other | Source: Ambulatory Visit | Attending: Family Medicine | Admitting: Family Medicine

## 2019-05-25 ENCOUNTER — Other Ambulatory Visit: Payer: Self-pay

## 2019-05-25 VITALS — BP 104/58 | HR 99 | Wt 274.0 lb

## 2019-05-25 DIAGNOSIS — Z3400 Encounter for supervision of normal first pregnancy, unspecified trimester: Secondary | ICD-10-CM | POA: Diagnosis present

## 2019-05-25 DIAGNOSIS — Z3A35 35 weeks gestation of pregnancy: Secondary | ICD-10-CM

## 2019-05-25 DIAGNOSIS — O99210 Obesity complicating pregnancy, unspecified trimester: Secondary | ICD-10-CM

## 2019-05-25 DIAGNOSIS — O24919 Unspecified diabetes mellitus in pregnancy, unspecified trimester: Secondary | ICD-10-CM

## 2019-05-25 DIAGNOSIS — O24913 Unspecified diabetes mellitus in pregnancy, third trimester: Secondary | ICD-10-CM

## 2019-05-25 DIAGNOSIS — O24419 Gestational diabetes mellitus in pregnancy, unspecified control: Secondary | ICD-10-CM

## 2019-05-25 DIAGNOSIS — O99213 Obesity complicating pregnancy, third trimester: Secondary | ICD-10-CM

## 2019-05-25 DIAGNOSIS — G4733 Obstructive sleep apnea (adult) (pediatric): Secondary | ICD-10-CM

## 2019-05-25 NOTE — Progress Notes (Signed)
   PRENATAL VISIT NOTE  Subjective:  Tracey Dunn is a 24 y.o. G1P0000 at [redacted]w[redacted]d being seen today for ongoing prenatal care.  She is currently monitored for the following issues for this high-risk pregnancy and has Anemia; Menorrhagia; Obesity due to excess calories; Supervision of normal first pregnancy, antepartum; Obesity affecting pregnancy, antepartum; LGSIL on Pap smear of cervix; OSA (obstructive sleep apnea); Overweight; Snoring; Alpha thalassemia silent carrier; and Gestational diabetes mellitus (GDM) on their problem list.  Patient reports occasional contractions.  Contractions: Not present. Vag. Bleeding: None.  Movement: Present. Denies leaking of fluid.   The following portions of the patient's history were reviewed and updated as appropriate: allergies, current medications, past family history, past medical history, past social history, past surgical history and problem list.   Objective:   Vitals:   05/25/19 1318  BP: (!) 104/58  Pulse: 99  Weight: 274 lb (124.3 kg)    Fetal Status: Fetal Heart Rate (bpm): 150 Fundal Height: 36 cm Movement: Present  Presentation: Vertex  General:  Alert, oriented and cooperative. Patient is in no acute distress.  Skin: Skin is warm and dry. No rash noted.   Cardiovascular: Normal heart rate noted  Respiratory: Normal respiratory effort, no problems with respiration noted  Abdomen: Soft, gravid, appropriate for gestational age.  Pain/Pressure: Present     Pelvic: Cervical exam deferred Dilation: Fingertip Effacement (%): Thick Station: -3  Extremities: Normal range of motion.  Edema: Trace  Mental Status: Normal mood and affect. Normal behavior. Normal judgment and thought content.   Assessment and Plan:  Pregnancy: G1P0000 at [redacted]w[redacted]d 1. Supervision of normal first pregnancy, antepartum FHT and FH normal - Culture, beta strep (group b only) - GC/Chlamydia probe amp ([place])not at [facility]  2. Diabetes mellitus affecting pregnancy,  antepartum 1 elevated fasting and 1 elevated PP. Continue diet control. Has rpt [place] in 2 weeks.  3. Obesity affecting pregnancy, antepartum  4. Gestational diabetes mellitus (GDM) in third trimester, gestational diabetes method of control unspecified  5. OSA (obstructive sleep apnea)   Preterm labor symptoms and general obstetric precautions including but not limited to vaginal bleeding, contractions, leaking of fluid and fetal movement were reviewed in detail with the patient. Please refer to After Visit Summary for other counseling recommendations.   Return in about 1 week (around 06/01/2019) for OB f/u, In Office.  Future Appointments  Date Time Provider Department Center  05/29/2019  8:45 AM Maryruth Bun, PT [facility]  05/31/2019  8:45 AM Anette Guarneri D, PT [facility]  06/02/2019  1:30 PM [facility] NURSE [facility]  06/02/2019  1:30 PM [facility] [place] 5 [facility]  06/05/2019  8:45 AM Kermit Balo, PTA [facility]  06/07/2019  8:45 AM Maryruth Bun, PT [facility]  06/12/2019  8:45 AM Kermit Balo, PTA [facility]  06/14/2019  8:45 AM Maryruth Bun, PT [facility]  06/19/2019  8:45 AM Kermit Balo, PTA [facility]  06/21/2019  8:45 AM Maryruth Bun, PT [facility]    Levie Heritage, DO

## 2019-05-26 LAB — GC/CHLAMYDIA PROBE AMP (~~LOC~~) NOT AT ARMC
Chlamydia: NEGATIVE
Comment: NEGATIVE
Comment: NORMAL
Neisseria Gonorrhea: NEGATIVE

## 2019-05-28 LAB — CULTURE, BETA STREP (GROUP B ONLY): Strep Gp B Culture: POSITIVE — AB

## 2019-05-29 ENCOUNTER — Encounter: Payer: Self-pay | Admitting: Family Medicine

## 2019-05-29 ENCOUNTER — Ambulatory Visit: Payer: Medicaid Other | Admitting: Physical Therapy

## 2019-05-29 DIAGNOSIS — O99820 Streptococcus B carrier state complicating pregnancy: Secondary | ICD-10-CM | POA: Insufficient documentation

## 2019-05-31 ENCOUNTER — Ambulatory Visit: Payer: Medicaid Other

## 2019-05-31 ENCOUNTER — Other Ambulatory Visit: Payer: Self-pay

## 2019-05-31 ENCOUNTER — Ambulatory Visit (INDEPENDENT_AMBULATORY_CARE_PROVIDER_SITE_OTHER): Payer: Medicaid Other | Admitting: Obstetrics & Gynecology

## 2019-05-31 VITALS — BP 124/62 | HR 91

## 2019-05-31 VITALS — BP 132/81 | HR 96 | Wt 272.0 lb

## 2019-05-31 DIAGNOSIS — Z3400 Encounter for supervision of normal first pregnancy, unspecified trimester: Secondary | ICD-10-CM

## 2019-05-31 DIAGNOSIS — R293 Abnormal posture: Secondary | ICD-10-CM

## 2019-05-31 DIAGNOSIS — O99210 Obesity complicating pregnancy, unspecified trimester: Secondary | ICD-10-CM

## 2019-05-31 DIAGNOSIS — D563 Thalassemia minor: Secondary | ICD-10-CM

## 2019-05-31 DIAGNOSIS — M5441 Lumbago with sciatica, right side: Secondary | ICD-10-CM | POA: Diagnosis not present

## 2019-05-31 DIAGNOSIS — O99213 Obesity complicating pregnancy, third trimester: Secondary | ICD-10-CM

## 2019-05-31 DIAGNOSIS — O24410 Gestational diabetes mellitus in pregnancy, diet controlled: Secondary | ICD-10-CM

## 2019-05-31 DIAGNOSIS — R29898 Other symptoms and signs involving the musculoskeletal system: Secondary | ICD-10-CM

## 2019-05-31 DIAGNOSIS — O99820 Streptococcus B carrier state complicating pregnancy: Secondary | ICD-10-CM

## 2019-05-31 DIAGNOSIS — Z3A36 36 weeks gestation of pregnancy: Secondary | ICD-10-CM

## 2019-05-31 DIAGNOSIS — M6281 Muscle weakness (generalized): Secondary | ICD-10-CM

## 2019-05-31 NOTE — Patient Instructions (Signed)
Natural Childbirth Natural childbirth is when labor and delivery progresses naturally with minimal medical assistance or medicines. Natural childbirth may be an option if you have a low risk pregnancy. With the help of a birthing professional such as a midwife, doula, or other health care provider, you may be able to use relaxation techniques and controlled breathing to manage pain during labor. Many women choose natural childbirth because it makes them feel more in control and in touch with the experience of giving birth. Some women also choose natural childbirth because they are concerned that medicines may affect them and their babies. What types of natural childbirth techniques are available? There are two types of natural childbirth techniques, which are taught in classes:  The Lamaze method. In these classes, parents learn that having a baby is normal, healthy, and natural. Mothers are taught to take a neutral position regarding pain medicine and numbing medicines, and to make an informed decision about using these medicines if the time comes.  The Bradley method, also called husband-coached birth. In these classes, the father or partner learns to be the birth coach. The mother is encouraged to exercise and eat a balanced, nutritious diet. Both parents also learn relaxation and deep breathing exercises and are taught how to prepare for emergency situations. What are some natural pain and relaxation techniques? If you are considering a natural childbirth, you should explore your options for managing pain and discomfort during your labor and delivery. Some natural pain and relaxation techniques include:  Meditation.  Yoga.  Hypnosis.  Acupuncture.  Massage.  Changing positions, such as by walking, rocking, showering, or leaning on birth balls.  Lying in warm water or a whirlpool bath.  Finding an activity that keeps your mind off the labor pain.  Listening to soft music.  Focusing  on a particular object (visual imagery). How can I prepare for a natural birth?  Talk with your spouse or partner about your goals for having a natural childbirth.  Decide if you will have your baby in the hospital, at a birthing center, or at home.  Have your spouse or partner attend the natural childbirth technique classes with you.  Decide which type of health care provider you would like to assist you with your delivery.  If you have other children, make plans to have someone take care of them when you go to the hospital or birthing center.  Know the distance and the time it takes to go to the hospital or birthing center. Practice going there and time it to be sure.  Have a bag packed with a nightgown, bathrobe, and toiletries. Be ready to take it with you when you go into labor.  Keep phone numbers of your family and friends handy if you need to call someone when you go into labor.  Talk with your health care provider about the possibility of a medical emergency and what will happen if that occurs. What are the advantages and disadvantages of natural childbirth? Advantages  You are in control of your labor and delivery experience.  You may be able to avoid some common medical practices, such as getting medicines or being monitored all the time.  You and your spouse or partner will work together, which can increase your bond with each other.  In most delivery centers, your family and friends can be involved in the labor and delivery process. Disadvantages  The methods of helping relieve labor pains may not work for you.  You may feel   disappointed if you change your mind during labor and decide not to have a natural childbirth. What can I expect after delivery?  You may feel very tired.  You may feel uncomfortable as your uterus contracts.  The area around your vagina will feel sore.  You may feel cold and shaky. What questions should I ask my health care  provider?  Am I a good candidate for natural childbirth?  Can you refer me to classes to learn more about natural childbirth?  How do I create a birth plan that outlines my wishes for natural childbirth? Where to find more information  American Pregnancy Association: americanpregnancy.org  American Congress of Obstetricians and Gynecologists: acog.org  American College of Nurse-Midwives: www.midwife.org Summary  Natural childbirth may be an option if you have a low risk pregnancy.  The Bradley or Lamaze Methods are techniques that can assist you in achieving a natural birth experience.  Talk to your health care provider to determine if you are a good candidate for a natural childbirth. This information is not intended to replace advice given to you by your health care provider. Make sure you discuss any questions you have with your health care provider. Document Revised: 07/29/2018 Document Reviewed: 06/15/2016 Elsevier Patient Education  2020 Elsevier Inc.  

## 2019-05-31 NOTE — Progress Notes (Signed)
   PRENATAL VISIT NOTE  Subjective:  Tracey Dunn is a 24 y.o. G1P0000 at [redacted]w[redacted]d being seen today for ongoing prenatal care.  She is currently monitored for the following issues for this high-risk pregnancy and has Anemia; Menorrhagia; Obesity due to excess calories; Supervision of normal first pregnancy, antepartum; Obesity affecting pregnancy, antepartum; LGSIL on Pap smear of cervix; OSA (obstructive sleep apnea); Overweight; Snoring; Alpha thalassemia silent carrier; Gestational diabetes mellitus (GDM); and GBS (group B Streptococcus carrier), +RV culture, currently pregnant on their problem list.  Patient reports no complaints.  Contractions: Irritability. Vag. Bleeding: None.  Movement: Present. Denies leaking of fluid.   The following portions of the patient's history were reviewed and updated as appropriate: allergies, current medications, past family history, past medical history, past social history, past surgical history and problem list.   Objective:   Vitals:   05/31/19 1321  BP: (!) 146/79  Pulse: (!) 108  Weight: 272 lb (123.4 kg)    Fetal Status: Fetal Heart Rate (bpm): 145   Movement: Present     General:  Alert, oriented and cooperative. Patient is in no acute distress.  Skin: Skin is warm and dry. No rash noted.   Cardiovascular: Normal heart rate noted  Respiratory: Normal respiratory effort, no problems with respiration noted  Abdomen: Soft, gravid, appropriate for gestational age.  Pain/Pressure: Present     Pelvic: Cervical exam deferred        Extremities: Normal range of motion.  Edema: Trace  Mental Status: Normal mood and affect. Normal behavior. Normal judgment and thought content.   Assessment and Plan:  Pregnancy: G1P0000 at [redacted]w[redacted]d 1. Supervision of normal first pregnancy, antepartum S>D Has [place] coming up in 3 days   2. Obesity affecting pregnancy, antepartum  3. Diet controlled gestational diabetes mellitus (GDM) in third trimester Fastign glc  80's 2 hour glc 115-120's  05/05/2019 FW:    2311  gm      5 lb 2 oz     71  %  4. GBS (group B Streptococcus carrier), +RV culture, currently pregnant Needs atbx in labor  5. Alpha thalassemia silent carrier  Preterm labor symptoms and general obstetric precautions including but not limited to vaginal bleeding, contractions, leaking of fluid and fetal movement were reviewed in detail with the patient. Please refer to After Visit Summary for other counseling recommendations.   Return in about 1 week (around 06/07/2019).  Future Appointments  Date Time Provider Department Center  06/02/2019  1:30 PM [facility] NURSE [facility]  06/02/2019  1:30 PM [facility] [place] 5 [facility]  06/05/2019  8:45 AM Kermit Balo, PTA [facility]  06/07/2019  8:45 AM Maryruth Bun, PT [facility]  06/12/2019  8:45 AM Kermit Balo, PTA [facility]  06/14/2019  8:45 AM Maryruth Bun, PT [facility]  06/19/2019  8:45 AM Kermit Balo, PTA [facility]  06/21/2019  8:45 AM Maryruth Bun, PT [facility]    [name], MD

## 2019-05-31 NOTE — Therapy (Addendum)
[facility] [address]  [place], [place], [address] Phone: [phone]   Fax:  [phone]  Physical Therapy Treatment  Patient Details  Name: Tracey Dunn MRN: [id number] Date of Birth: 25-Mar-1996 Referring Provider (PT): Hansel Feinstein, CNM   Encounter Date: 05/31/2019  PT End of Session - 05/31/19 0858    Visit Number  5    Number of Visits  16    Date for PT Re-Evaluation  07/04/19    Authorization Type  Medicaid    Authorization Time Period  05/29/19 - 07/09/19    Authorization - Visit Number  1    Authorization - Number of Visits  12    PT Start Time  0853    PT Stop Time  0931    PT Time Calculation (min)  38 min    Activity Tolerance  Patient tolerated treatment well;Patient limited by pain    Behavior During Therapy  [facility] for tasks assessed/performed       Past Medical History:  Diagnosis Date  . Anemia     Past Surgical History:  Procedure Laterality Date  . TONSILLECTOMY    . WISDOM TOOTH EXTRACTION      Vitals:   05/31/19 0857  BP: 124/62  Pulse: 91  SpO2: 98%    Subjective Assessment - 05/31/19 0857    Subjective  Pt. reporting "pressure" feeling mostly at morning and night when sitting down.    Pertinent History  current pregnancy in 3rd trimester, gestational DM, anemia    Diagnostic tests  none recent    Patient Stated Goals  "be able to sit down and lay down"    Currently in Pain?  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 to get up and move around her home frequently as to avoid postural pain.      Other Self-Care Comments   Self tennis ball release on wall to B glutes/piriformis, lateral hips       Lumbar Exercises: Stretches   Passive Hamstring Stretch  Right;Left;1 rep;30 seconds    Passive Hamstring Stretch Limitations  supine with strap     Hip Flexor Stretch  Right;Left;30 seconds;4 reps    Hip Flexor Stretch Limitations  sitting on chair on half-lunge position      Lumbar Exercises: Aerobic   Nustep  Lvl 3, 6 min (LE only)      Knee/Hip Exercises: Standing   Hip Abduction  Left;Right;10 reps;Knee straight;Stengthening    Abduction Limitations  at counter      Knee/Hip Exercises: Seated   Ball Squeeze  15x5"               PT Short Term Goals - 05/23/19 0901      PT SHORT TERM GOAL #  1   Title  Patient to be independent with initial HEP.    Time  1    Period  Weeks    Status  Achieved    Target Date  05/03/19        PT Long Term Goals - 05/23/19 0901      PT LONG TERM GOAL #1   Title  Patient to be independent with advanced HEP.    Time  6    Period  Weeks    Status  Partially Met   met for current   Target Date  07/04/19      PT LONG TERM GOAL #2   Title  Patient to demonstrate B LE strength >/=4+/5.    Time  6    Period  Weeks    Status  Partially Met   improved B knee flexion, R knee extension, and R ankle dorsiflexion/plantarflexion strength   Target Date  07/04/19      PT LONG TERM GOAL #3   Title  Patient to report and demonstrate use of proper posture and body mechanics with ADLs to decrease strain on LB.    Time  6    Period  Weeks    Status  On-going   unable to sit in a chair for work d/t pain, opting to sit in bed   Target Date  07/04/19      PT LONG TERM GOAL #4   Title  Patient to report tolerance of 1 hour of sitting without pain in order to improve ability to tolerate work.    Time  6    Period  Weeks    Status   On-going   reports 5-10 min   Target Date  07/04/19            Plan - 05/31/19 0859    Clinical Impression Statement  Pt. primary complaint today is "pressure" pain on B anterior/lateral hips.  Pt. noting significant relief from B seated hip flexor stretching thus mixed this into standing there today.  Tolerated standing hip strengthening today and seated adduction strengthening well.  Did review tennis ball self-release on wall to B glutes and TFL with some relief noted.  Ended visit with "pressure" pain decreasing to 3/10 and pt. noting relief from anterior hip tightness.    Comorbidities  gestational DM, anemia    Rehab Potential  Good    PT Treatment/Interventions  ADLs/Self Care Home Management;Cryotherapy;Moist Heat;Balance training;Therapeutic exercise;Therapeutic activities;Functional mobility training;Stair training;Gait training;Neuromuscular re-education;Patient/family education;Manual techniques;Taping;Energy conservation;Dry needling;Passive range of motion    PT Next Visit Plan  STM, hip stretching, gentle hip strengthening to tolerance    Consulted and Agree with Plan of Care  Patient       Patient will benefit from skilled therapeutic intervention in order to improve the following deficits and impairments:  Decreased activity tolerance, Decreased strength, Pain, Increased muscle spasms, Improper body mechanics, Decreased range of motion, Impaired flexibility, Postural dysfunction  Visit Diagnosis: Acute bilateral low back pain with right-sided sciatica  Muscle weakness (generalized)  Other symptoms and signs involving the musculoskeletal system  Abnormal posture     Problem List Patient Active Problem List   Diagnosis Date Noted  . GBS (group B Streptococcus carrier), +RV culture, currently pregnant 05/29/2019  . Gestational diabetes mellitus (GDM) 04/03/2019  . Alpha thalassemia silent carrier 02/07/2019  . 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  Supervision of normal first pregnancy, antepartum 01/06/2019  . Obesity affecting pregnancy, antepartum  01/06/2019  . Overweight 09/23/2017  . OSA (obstructive sleep apnea) 09/10/2016  . Snoring 09/10/2016  . Anemia 04/01/2015  . Menorrhagia 04/01/2015  . Obesity due to excess calories 04/01/2015    Bess Harvest, PTA 05/31/19 10:13 AM    [facility] [address]  [place], [place], [address] Phone: [phone]   Fax:  [phone]  Name: Tracey Dunn MRN: [id number] Date of Birth: October 29, 1995  PHYSICAL THERAPY DISCHARGE SUMMARY  Visits from Start of Care: 5  Current functional level related to goals / functional outcomes: Unable to assess; patient did not return since giving birth   Remaining deficits: Unable to assess   Education / Equipment: HEP  Plan: Patient agrees to discharge.  Patient goals were not met. Patient is being discharged due to a change in medical status.  ?????     Janene Harvey, PT, DPT 06/21/19 4:46 PM

## 2019-06-02 ENCOUNTER — Inpatient Hospital Stay (HOSPITAL_COMMUNITY)
Admission: AD | Admit: 2019-06-02 | Discharge: 2019-06-05 | DRG: 807 | Disposition: A | Discharge status: Home / Self Care | Payer: Medicaid Other | Attending: Obstetrics and Gynecology | Admitting: Obstetrics and Gynecology

## 2019-06-02 ENCOUNTER — Ambulatory Visit (HOSPITAL_COMMUNITY): Payer: Medicaid Other | Admitting: *Deleted

## 2019-06-02 ENCOUNTER — Other Ambulatory Visit: Payer: Self-pay

## 2019-06-02 ENCOUNTER — Encounter (HOSPITAL_COMMUNITY): Payer: Self-pay | Admitting: Obstetrics & Gynecology

## 2019-06-02 ENCOUNTER — Ambulatory Visit (HOSPITAL_BASED_OUTPATIENT_CLINIC_OR_DEPARTMENT_OTHER)
Admission: RE | Admit: 2019-06-02 | Discharge: 2019-06-02 | Disposition: A | Discharge status: Home / Self Care | Payer: Medicaid Other | Source: Ambulatory Visit | Attending: Obstetrics and Gynecology | Admitting: Obstetrics and Gynecology

## 2019-06-02 ENCOUNTER — Encounter (HOSPITAL_COMMUNITY): Payer: Self-pay

## 2019-06-02 DIAGNOSIS — Z3A37 37 weeks gestation of pregnancy: Secondary | ICD-10-CM

## 2019-06-02 DIAGNOSIS — Z362 Encounter for other antenatal screening follow-up: Secondary | ICD-10-CM

## 2019-06-02 DIAGNOSIS — O134 Gestational [pregnancy-induced] hypertension without significant proteinuria, complicating childbirth: Principal | ICD-10-CM | POA: Diagnosis present

## 2019-06-02 DIAGNOSIS — R87612 Low grade squamous intraepithelial lesion on cytologic smear of cervix (LGSIL): Secondary | ICD-10-CM

## 2019-06-02 DIAGNOSIS — Z20822 Contact with and (suspected) exposure to covid-19: Secondary | ICD-10-CM | POA: Diagnosis present

## 2019-06-02 DIAGNOSIS — O24419 Gestational diabetes mellitus in pregnancy, unspecified control: Secondary | ICD-10-CM | POA: Diagnosis present

## 2019-06-02 DIAGNOSIS — O99820 Streptococcus B carrier state complicating pregnancy: Secondary | ICD-10-CM

## 2019-06-02 DIAGNOSIS — O99013 Anemia complicating pregnancy, third trimester: Secondary | ICD-10-CM | POA: Diagnosis not present

## 2019-06-02 DIAGNOSIS — D509 Iron deficiency anemia, unspecified: Secondary | ICD-10-CM | POA: Diagnosis present

## 2019-06-02 DIAGNOSIS — O133 Gestational [pregnancy-induced] hypertension without significant proteinuria, third trimester: Secondary | ICD-10-CM

## 2019-06-02 DIAGNOSIS — O24420 Gestational diabetes mellitus in childbirth, diet controlled: Secondary | ICD-10-CM | POA: Diagnosis not present

## 2019-06-02 DIAGNOSIS — O99214 Obesity complicating childbirth: Secondary | ICD-10-CM | POA: Diagnosis present

## 2019-06-02 DIAGNOSIS — O26843 Uterine size-date discrepancy, third trimester: Secondary | ICD-10-CM | POA: Diagnosis not present

## 2019-06-02 DIAGNOSIS — O99210 Obesity complicating pregnancy, unspecified trimester: Secondary | ICD-10-CM | POA: Diagnosis present

## 2019-06-02 DIAGNOSIS — O24410 Gestational diabetes mellitus in pregnancy, diet controlled: Secondary | ICD-10-CM

## 2019-06-02 DIAGNOSIS — Z789 Other specified health status: Secondary | ICD-10-CM

## 2019-06-02 DIAGNOSIS — O139 Gestational [pregnancy-induced] hypertension without significant proteinuria, unspecified trimester: Secondary | ICD-10-CM | POA: Diagnosis present

## 2019-06-02 DIAGNOSIS — Z3400 Encounter for supervision of normal first pregnancy, unspecified trimester: Secondary | ICD-10-CM | POA: Insufficient documentation

## 2019-06-02 DIAGNOSIS — O99824 Streptococcus B carrier state complicating childbirth: Secondary | ICD-10-CM | POA: Diagnosis present

## 2019-06-02 DIAGNOSIS — O9902 Anemia complicating childbirth: Secondary | ICD-10-CM | POA: Diagnosis present

## 2019-06-02 DIAGNOSIS — D649 Anemia, unspecified: Secondary | ICD-10-CM | POA: Diagnosis present

## 2019-06-02 LAB — URINALYSIS, ROUTINE W REFLEX MICROSCOPIC
Bilirubin Urine: NEGATIVE
Glucose, UA: NEGATIVE mg/dL
Hgb urine dipstick: NEGATIVE
Ketones, ur: NEGATIVE mg/dL
Nitrite: NEGATIVE
Protein, ur: 30 mg/dL — AB
Specific Gravity, Urine: 1.026 (ref 1.005–1.030)
pH: 5 (ref 5.0–8.0)

## 2019-06-02 LAB — COMPREHENSIVE METABOLIC PANEL
ALT: 13 U/L (ref 0–44)
AST: 19 U/L (ref 15–41)
Albumin: 2.4 g/dL — ABNORMAL LOW (ref 3.5–5.0)
Alkaline Phosphatase: 111 U/L (ref 38–126)
Anion gap: 11 (ref 5–15)
BUN: <5 mg/dL — ABNORMAL LOW (ref 6–20)
CO2: 17 mmol/L — ABNORMAL LOW (ref 22–32)
Calcium: 8.8 mg/dL — ABNORMAL LOW (ref 8.9–10.3)
Chloride: 109 mmol/L (ref 98–111)
Creatinine, Ser: 0.56 mg/dL (ref 0.44–1.00)
GFR calc Af Amer: >60 mL/min (ref >60)
GFR calc non Af Amer: >60 mL/min (ref >60)
Glucose, Bld: 84 mg/dL (ref 70–99)
Potassium: 3.9 mmol/L (ref 3.5–5.1)
Sodium: 137 mmol/L (ref 135–145)
Total Bilirubin: 0.8 mg/dL (ref 0.3–1.2)
Total Protein: 6.3 g/dL — ABNORMAL LOW (ref 6.5–8.1)

## 2019-06-02 LAB — CBC WITH DIFFERENTIAL/PLATELET
Abs Immature Granulocytes: 0 10*3/uL (ref 0.00–0.07)
Basophils Absolute: 0 10*3/uL (ref 0.0–0.1)
Basophils Relative: 0 %
Eosinophils Absolute: 0 10*3/uL (ref 0.0–0.5)
Eosinophils Relative: 0 %
HCT: 30.9 % — ABNORMAL LOW (ref 36.0–46.0)
Hemoglobin: 8.9 g/dL — ABNORMAL LOW (ref 12.0–15.0)
Lymphocytes Relative: 19 %
Lymphs Abs: 1.5 10*3/uL (ref 0.7–4.0)
MCH: 18.9 pg — ABNORMAL LOW (ref 26.0–34.0)
MCHC: 28.8 g/dL — ABNORMAL LOW (ref 30.0–36.0)
MCV: 65.5 fL — ABNORMAL LOW (ref 80.0–100.0)
Monocytes Absolute: 0.3 10*3/uL (ref 0.1–1.0)
Monocytes Relative: 4 %
Neutro Abs: 6.2 10*3/uL (ref 1.7–7.7)
Neutrophils Relative %: 77 %
Platelets: 223 10*3/uL (ref 150–400)
RBC: 4.72 MIL/uL (ref 3.87–5.11)
RDW: 18.9 % — ABNORMAL HIGH (ref 11.5–15.5)
WBC: 8.1 10*3/uL (ref 4.0–10.5)
nRBC: 0 % (ref 0.0–0.2)
nRBC: 0 /100 WBC

## 2019-06-02 LAB — TYPE AND SCREEN
ABO/RH(D): O POS
Antibody Screen: NEGATIVE

## 2019-06-02 LAB — GLUCOSE, CAPILLARY
Glucose-Capillary: 76 mg/dL (ref 70–99)
Glucose-Capillary: 98 mg/dL (ref 70–99)

## 2019-06-02 LAB — SARS CORONAVIRUS 2 (TAT 6-24 HRS): SARS Coronavirus 2: NEGATIVE

## 2019-06-02 LAB — PROTEIN / CREATININE RATIO, URINE
Creatinine, Urine: 334.3 mg/dL
Protein Creatinine Ratio: 0.08 mg/mg{Cre} (ref 0.00–0.15)
Total Protein, Urine: 27 mg/dL

## 2019-06-02 LAB — ABO/RH: ABO/RH(D): O POS

## 2019-06-02 MED ORDER — ACETAMINOPHEN 325 MG PO TABS
650.0000 mg | ORAL_TABLET | ORAL | Status: DC | PRN
  Administered 2019-06-03: 650 mg via ORAL
  Filled 2019-06-02: qty 2

## 2019-06-02 MED ORDER — LACTATED RINGERS IV SOLN: 500.0000 mL | INTRAVENOUS | Status: DC | PRN

## 2019-06-02 MED ORDER — OXYTOCIN BOLUS FROM INFUSION
500.0000 mL | Freq: Once | INTRAVENOUS | Status: AC
  Administered 2019-06-03: 500 mL via INTRAVENOUS

## 2019-06-02 MED ORDER — TERBUTALINE SULFATE 1 MG/ML IJ SOLN: 0.2500 mg | Freq: Once | INTRAMUSCULAR | Status: DC | PRN

## 2019-06-02 MED ORDER — LIDOCAINE HCL (PF) 1 % IJ SOLN: 30.0000 mL | INTRAMUSCULAR | Status: DC | PRN

## 2019-06-02 MED ORDER — SOD CITRATE-CITRIC ACID 500-334 MG/5ML PO SOLN
30.0000 mL | ORAL | Status: DC | PRN
  Administered 2019-06-03 (×2): 30 mL via ORAL
  Filled 2019-06-02 (×2): qty 30

## 2019-06-02 MED ORDER — OXYTOCIN 40 UNITS IN NORMAL SALINE INFUSION - SIMPLE MED: 2.5000 [IU]/h | INTRAVENOUS | Status: DC

## 2019-06-02 MED ORDER — MISOPROSTOL 25 MCG QUARTER TABLET
25.0000 ug | ORAL_TABLET | ORAL | Status: DC | PRN
  Administered 2019-06-02 (×2): 25 ug via VAGINAL
  Filled 2019-06-02 (×2): qty 1

## 2019-06-02 MED ORDER — OXYCODONE-ACETAMINOPHEN 5-325 MG PO TABS: 1.0000 | ORAL_TABLET | ORAL | Status: DC | PRN

## 2019-06-02 MED ORDER — LACTATED RINGERS IV SOLN: INTRAVENOUS | Status: DC

## 2019-06-02 MED ORDER — SODIUM CHLORIDE 0.9 % IV SOLN
5.0000 10*6.[IU] | Freq: Once | INTRAVENOUS | Status: AC
  Administered 2019-06-02: 5 10*6.[IU] via INTRAVENOUS
  Filled 2019-06-02: qty 5

## 2019-06-02 MED ORDER — PENICILLIN G POT IN DEXTROSE 60000 UNIT/ML IV SOLN
3.0000 10*6.[IU] | INTRAVENOUS | Status: DC
  Administered 2019-06-02 – 2019-06-03 (×5): 3 10*6.[IU] via INTRAVENOUS
  Filled 2019-06-02 (×5): qty 50

## 2019-06-02 MED ORDER — ONDANSETRON HCL 4 MG/2ML IJ SOLN
4.0000 mg | Freq: Four times a day (QID) | INTRAMUSCULAR | Status: DC | PRN
  Administered 2019-06-03: 4 mg via INTRAVENOUS
  Filled 2019-06-02: qty 2

## 2019-06-02 MED ORDER — OXYCODONE-ACETAMINOPHEN 5-325 MG PO TABS: 2.0000 | ORAL_TABLET | ORAL | Status: DC | PRN

## 2019-06-02 NOTE — H&P (Signed)
OBSTETRIC ADMISSION HISTORY AND PHYSICAL  Tracey Dunn is a 24 y.o. female G1P0000 with IUP at 94w0dby LMP presenting for IOL for GHTN. She reports +FMs, No LOF, no VB, no blurry vision, headaches and RUQ pain.  She plans on breast feeding. She request POPs for birth control. [facility] Tracey Dunn a 24y.o. G1P0000 at 321w0dho presents to [facility] today from MFM for further evaluation of elevated blood pressure. The patient also had elevated BP in the office earlier this week. Prior to that values had been normal. She states a few mildly elevated home values recently as well. She denies HA, visual changes or RUQ pain. She has noted increased LE edema in the last 1-2 weeks. She denies vaginal bleeding or LOF. She has few, irregular contractions. She reports normal fetal movement. Her pregnancy is also complicated by GDM.   Dating: By LMP --->  Estimated Date of Delivery: 06/23/19  Sono:    @[redacted]w[redacted]d , CWD, normal anatomy, breech presentation,  251g, 27% EFW   Prenatal History/Complications:  Past Medical History: Past Medical History:  Diagnosis Date  . Anemia     Past Surgical History: Past Surgical History:  Procedure Laterality Date  . TONSILLECTOMY    . WISDOM TOOTH EXTRACTION      Obstetrical History: OB History    Gravida  1   Para  0   Term  0   Preterm  0   AB  0   Living  0     SAB  0   TAB  0   Ectopic  0   Multiple  0   Live Births              Social History Social History   Socioeconomic History  . Marital status: Single    Spouse name: Not on file  . Number of children: Not on file  . Years of education: Not on file  . Highest education level: Not on file  Occupational History  . Not on file  Tobacco Use  . Smoking status: Never Smoker  . Smokeless tobacco: Never Used  Substance and Sexual Activity  . Alcohol use: Not Currently    Comment: occ  . Drug use: No  . Sexual activity: Yes    Birth  control/protection: Condom  Other Topics Concern  . Not on file  Social History Narrative  . Not on file   Social Determinants of Health   Financial Resource Strain:   . Difficulty of Paying Living Expenses: Not on file  Food Insecurity:   . Worried About RuCharity fundraisern the Last Year: Not on file  . Ran Out of Food in the Last Year: Not on file  Transportation Needs:   . Lack of Transportation (Medical): Not on file  . Lack of Transportation (Non-Medical): Not on file  Physical Activity:   . Days of Exercise per Week: Not on file  . Minutes of Exercise per Session: Not on file  Stress:   . Feeling of Stress : Not on file  Social Connections:   . Frequency of Communication with Friends and Family: Not on file  . Frequency of Social Gatherings with Friends and Family: Not on file  . Attends Religious Services: Not on file  . Active Member of Clubs or Organizations: Not on file  . Attends ClArchivisteetings: Not on file  . Marital Status: Not on file  Family History: Family History  Problem Relation Age of Onset  . Hypertension Mother   . Diabetes Mother        Type 1  . Hypertension Maternal Grandmother   . Cancer Neg Hx   . Stroke Neg Hx     Allergies: Allergies  Allergen Reactions  . Sulfur Hives  . Other     Other reaction(s): Other (See Comments) Unknown  . Sulfa Antibiotics   . Sulfasalazine     Medications Prior to Admission  Medication Sig Dispense Refill Last Dose  . Accu-Chek FastClix Lancets MISC 1 Device by Percutaneous route 4 (four) times daily. Dx: O02.419 check BS QID 100 each 12 06/01/2019 at Unknown time  . acetaminophen (TYLENOL) 500 MG tablet Take 1 tablet (500 mg total) by mouth every 6 (six) hours as needed. 30 tablet 0 Past Month at Unknown time  . cetirizine (ZYRTEC) 10 MG tablet Take 1 tablet (10 mg total) by mouth daily. 90 tablet 3 Past Week at Unknown time  . glucose blood (ACCU-CHEK GUIDE) test strip Use as  instructed 100 each 12 06/01/2019 at Unknown time  . Prenatal Vit-Fe Fumarate-FA (PRENATAL VITAMINS PO) Take by mouth.   06/02/2019 at Unknown time  . AMBULATORY NON FORMULARY MEDICATION 1 Device by Other route once a week. Blood pressure cuff/Large  Monitored Regularly at home ICD 10: Z34.90 LROB 1 kit 0   . budesonide (RHINOCORT AQUA) 32 MCG/ACT nasal spray Place into the nose.   More than a month at Unknown time  . ferrous sulfate 325 (65 FE) MG tablet Take 325 mg by mouth daily with breakfast.   More than a month at Unknown time  . fluticasone (FLONASE) 50 MCG/ACT nasal spray Place 1 spray into both nostrils daily. (Patient not taking: Reported on 06/02/2019) 11.1 g 2 More than a month at Unknown time  . guaifenesin (ROBITUSSIN) 100 MG/5ML syrup Take 5-10 mLs (100-200 mg total) by mouth every 4 (four) hours as needed for cough. (Patient not taking: Reported on 06/02/2019) 60 mL 0 More than a month at Unknown time     Review of Systems   All systems reviewed and negative except as stated in HPI  Blood pressure 128/79, pulse 99, temperature 98.4 F (36.9 C), temperature source Oral, resp. rate 18, last menstrual period 09/16/2018, SpO2 99 %. General appearance: alert and cooperative Lungs: normal effort Heart: regular rate Abdomen: soft, non-tender Pelvic: deferred Extremities: No sign of DVT DTR's normal, no clonus  Presentation: cephalic Fetal monitoringBaseline: 145 bpm, Variability: Good {> 6 bpm), Accelerations: Reactive and Decelerations: Absent Uterine activityNone     Prenatal labs: ABO, Rh: O/Positive/-- (09/18 1014) Antibody: Negative (09/18 1014) Rubella: 24.70 (09/18 1014) RPR: Non Reactive (12/11 0925)  HBsAg: Negative (09/18 1014)  HIV: Non Reactive (12/11 0925)  GBS: Positive/-- (02/04 1330)  3 hr Glucola [phone] Genetic screening  NIPS normal, AFP Negative Anatomy US - normal, incomplete, follow-up normal  Prenatal Transfer Tool  Maternal Diabetes: Yes:   Diabetes Type:  Diet controlled Genetic Screening: Normal Maternal Ultrasounds/Referrals: Normal Fetal Ultrasounds or other Referrals:  None Maternal Substance Abuse:  No Significant Maternal Medications:  None Significant Maternal Lab Results: Group B Strep positive  Results for orders placed or performed during the hospital encounter of 06/02/19 (from the past 24 hour(s))  CBC with Differential/Platelet   Collection Time: 06/02/19  3:50 PM  Result Value Ref Range   WBC 8.1 4.0 - 10.5 K/uL   RBC 4.72 3.87 - 5.11  MIL/uL   Hemoglobin 8.9 (L) 12.0 - 15.0 g/dL   HCT 30.9 (L) 36.0 - 46.0 %   MCV 65.5 (L) 80.0 - 100.0 fL   MCH 18.9 (L) 26.0 - 34.0 pg   MCHC 28.8 (L) 30.0 - 36.0 g/dL   RDW 18.9 (H) 11.5 - 15.5 %   Platelets 223 150 - 400 K/uL   nRBC 0.0 0.0 - 0.2 %   Neutrophils Relative % 77 %   Neutro Abs 6.2 1.7 - 7.7 K/uL   Lymphocytes Relative 19 %   Lymphs Abs 1.5 0.7 - 4.0 K/uL   Monocytes Relative 4 %   Monocytes Absolute 0.3 0.1 - 1.0 K/uL   Eosinophils Relative 0 %   Eosinophils Absolute 0.0 0.0 - 0.5 K/uL   Basophils Relative 0 %   Basophils Absolute 0.0 0.0 - 0.1 K/uL   nRBC 0 0 /100 WBC   Abs Immature Granulocytes 0.00 0.00 - 0.07 K/uL   Tear Drop Cells PRESENT    Sickle Cells PRESENT    Ovalocytes PRESENT   Comprehensive metabolic panel   Collection Time: 06/02/19  3:50 PM  Result Value Ref Range   Sodium 137 135 - 145 mmol/L   Potassium 3.9 3.5 - 5.1 mmol/L   Chloride 109 98 - 111 mmol/L   CO2 17 (L) 22 - 32 mmol/L   Glucose, Bld 84 70 - 99 mg/dL   BUN <5 (L) 6 - 20 mg/dL   Creatinine, Ser 0.56 0.44 - 1.00 mg/dL   Calcium 8.8 (L) 8.9 - 10.3 mg/dL   Total Protein 6.3 (L) 6.5 - 8.1 g/dL   Albumin 2.4 (L) 3.5 - 5.0 g/dL   AST 19 15 - 41 U/L   ALT 13 0 - 44 U/L   Alkaline Phosphatase 111 38 - 126 U/L   Total Bilirubin 0.8 0.3 - 1.2 mg/dL   GFR calc non Af Amer >60 >60 mL/min   GFR calc Af Amer >60 >60 mL/min   Anion gap 11 5 - 15  Urinalysis, Routine w  reflex microscopic   Collection Time: 06/02/19  3:59 PM  Result Value Ref Range   Color, Urine AMBER (A) YELLOW   APPearance CLOUDY (A) CLEAR   Specific Gravity, Urine 1.026 1.005 - 1.030   pH 5.0 5.0 - 8.0   Glucose, UA NEGATIVE NEGATIVE mg/dL   Hgb urine dipstick NEGATIVE NEGATIVE   Bilirubin Urine NEGATIVE NEGATIVE   Ketones, ur NEGATIVE NEGATIVE mg/dL   Protein, ur 30 (A) NEGATIVE mg/dL   Nitrite NEGATIVE NEGATIVE   Leukocytes,Ua TRACE (A) NEGATIVE   RBC / HPF 0-5 0 - 5 RBC/hpf   WBC, UA 11-20 0 - 5 WBC/hpf   Bacteria, UA MANY (A) NONE SEEN   Squamous Epithelial / LPF 6-10 0 - 5   Mucus PRESENT   Protein / creatinine ratio, urine   Collection Time: 06/02/19  4:00 PM  Result Value Ref Range   Creatinine, Urine 334.30 mg/dL   Total Protein, Urine 27 mg/dL   Protein Creatinine Ratio 0.08 0.00 - 0.15 mg/mg[Cre]    Patient Active Problem List   Diagnosis Date Noted  . GBS (group B Streptococcus carrier), +RV culture, currently pregnant 05/29/2019  . Gestational diabetes mellitus (GDM) 04/03/2019  . Alpha thalassemia silent carrier 02/07/2019  . 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 Supervision of normal first pregnancy, antepartum 01/06/2019  . Obesity affecting pregnancy, antepartum 01/06/2019  . Overweight 09/23/2017  . OSA (obstructive sleep apnea)  09/10/2016  . Snoring 09/10/2016  . Anemia 04/01/2015  . Menorrhagia 04/01/2015  . Obesity due to excess calories 04/01/2015    Assessment/Plan:  Tracey Dunn is a 24 y.o. G1P0000 at 59w0dhere for IOL for GHTN. Also with GDM, diet controlled.   #Labor: Plan for IOL #Pain: Per request #FWB: Category 1 #ID:  +GBS #MOF: Breast #MOC: POPs #Circ:  Yes  JKerry Hough PA-C  06/02/2019, 4:48 PM

## 2019-06-02 NOTE — Progress Notes (Signed)
Chieko Neises is a 24 y.o. G1P0000 at [redacted]w[redacted]d   Subjective: Patient denies pain, headache, visual disturbances, RUQ/epigastric pain  Objective: BP (!) 146/84   Pulse 98   Temp 98.4 F (36.9 C) (Oral)   Resp 16   LMP 09/16/2018 (Exact Date)   SpO2 99%  No intake/output data recorded. No intake/output data recorded.  FHT:  FHR: 145 bpm, variability: moderate,  accelerations:  Present,  decelerations:  Absent UC:   occaisonal SVE:   Dilation: Fingertip Effacement (%): Thick Station: -3 Exam by:: [employer] Rn  Labs: Lab Results  Component Value Date   WBC 8.1 06/02/2019   HGB 8.9 (L) 06/02/2019   HCT 30.9 (L) 06/02/2019   MCV 65.5 (L) 06/02/2019   PLT 223 06/02/2019    Assessment / Plan: -23 y.o. G1P0000 at [redacted]w[redacted]d  --Category I tracing --GBS +. PCN prophylaxis initiated --Gestational Hypertension: no severe range or severe features --A1GDM: q 4 hour CBGs until active labor --Cytotec #1 placed 1755 (25 mcg vaginally) --Assess for foley balloon placement with future exams. Preemptive teaching provided --Anticipate NSVD  Calvert Cantor, CNM 06/02/2019, 7:28 PM

## 2019-06-02 NOTE — MAU Note (Signed)
Pt presents to MAU from MFM with elevated blood pressures (150s/90s). She denies HA, visual changes, epigastric pain but has had increased swelling. She is not taking any medication for BP. +FM

## 2019-06-03 ENCOUNTER — Inpatient Hospital Stay (HOSPITAL_COMMUNITY): Payer: Medicaid Other | Admitting: Anesthesiology

## 2019-06-03 DIAGNOSIS — O134 Gestational [pregnancy-induced] hypertension without significant proteinuria, complicating childbirth: Secondary | ICD-10-CM

## 2019-06-03 DIAGNOSIS — Z3A37 37 weeks gestation of pregnancy: Secondary | ICD-10-CM

## 2019-06-03 DIAGNOSIS — O24420 Gestational diabetes mellitus in childbirth, diet controlled: Secondary | ICD-10-CM

## 2019-06-03 DIAGNOSIS — O99824 Streptococcus B carrier state complicating childbirth: Secondary | ICD-10-CM

## 2019-06-03 LAB — CBC
HCT: 30.4 % — ABNORMAL LOW (ref 36.0–46.0)
Hemoglobin: 8.7 g/dL — ABNORMAL LOW (ref 12.0–15.0)
MCH: 19.1 pg — ABNORMAL LOW (ref 26.0–34.0)
MCHC: 28.6 g/dL — ABNORMAL LOW (ref 30.0–36.0)
MCV: 66.7 fL — ABNORMAL LOW (ref 80.0–100.0)
Platelets: 214 10*3/uL (ref 150–400)
RBC: 4.56 MIL/uL (ref 3.87–5.11)
RDW: 19.2 % — ABNORMAL HIGH (ref 11.5–15.5)
WBC: 9.9 10*3/uL (ref 4.0–10.5)
nRBC: 0 % (ref 0.0–0.2)

## 2019-06-03 LAB — GLUCOSE, CAPILLARY
Glucose-Capillary: 106 mg/dL — ABNORMAL HIGH (ref 70–99)
Glucose-Capillary: 97 mg/dL (ref 70–99)
Glucose-Capillary: 99 mg/dL (ref 70–99)

## 2019-06-03 LAB — RPR: RPR Ser Ql: NONREACTIVE

## 2019-06-03 MED ORDER — LIDOCAINE-EPINEPHRINE (PF) 2 %-1:200000 IJ SOLN
INTRAMUSCULAR | Status: DC | PRN
  Administered 2019-06-03: 7 mL via EPIDURAL

## 2019-06-03 MED ORDER — FENTANYL-BUPIVACAINE-NACL 0.5-0.125-0.9 MG/250ML-% EP SOLN
12.0000 mL/h | EPIDURAL | Status: DC | PRN
  Filled 2019-06-03: qty 250

## 2019-06-03 MED ORDER — TETANUS-DIPHTH-ACELL PERTUSSIS 5-2.5-18.5 LF-MCG/0.5 IM SUSP: 0.5000 mL | Freq: Once | INTRAMUSCULAR | Status: DC

## 2019-06-03 MED ORDER — DIPHENHYDRAMINE HCL 25 MG PO CAPS: 25.0000 mg | ORAL_CAPSULE | Freq: Four times a day (QID) | ORAL | Status: DC | PRN

## 2019-06-03 MED ORDER — LACTATED RINGERS IV SOLN
500.0000 mL | Freq: Once | INTRAVENOUS | Status: AC
  Administered 2019-06-03: 500 mL via INTRAVENOUS

## 2019-06-03 MED ORDER — SIMETHICONE 80 MG PO CHEW: 80.0000 mg | CHEWABLE_TABLET | ORAL | Status: DC | PRN

## 2019-06-03 MED ORDER — PHENYLEPHRINE 40 MCG/ML (10ML) SYRINGE FOR IV PUSH (FOR BLOOD PRESSURE SUPPORT): 80.0000 ug | PREFILLED_SYRINGE | INTRAVENOUS | Status: DC | PRN

## 2019-06-03 MED ORDER — SODIUM CHLORIDE (PF) 0.9 % IJ SOLN
INTRAMUSCULAR | Status: DC | PRN
  Administered 2019-06-03: 12 mL/h via EPIDURAL

## 2019-06-03 MED ORDER — SENNOSIDES-DOCUSATE SODIUM 8.6-50 MG PO TABS
2.0000 | ORAL_TABLET | ORAL | Status: DC
  Administered 2019-06-04 (×2): 2 via ORAL
  Filled 2019-06-03 (×2): qty 2

## 2019-06-03 MED ORDER — FENTANYL CITRATE (PF) 100 MCG/2ML IJ SOLN
100.0000 ug | INTRAMUSCULAR | Status: DC | PRN
  Administered 2019-06-03 (×3): 100 ug via INTRAVENOUS
  Filled 2019-06-03 (×3): qty 2

## 2019-06-03 MED ORDER — ACETAMINOPHEN 325 MG PO TABS: 650.0000 mg | ORAL_TABLET | ORAL | Status: DC | PRN

## 2019-06-03 MED ORDER — ONDANSETRON HCL 4 MG/2ML IJ SOLN: 4.0000 mg | INTRAMUSCULAR | Status: DC | PRN

## 2019-06-03 MED ORDER — IBUPROFEN 600 MG PO TABS
600.0000 mg | ORAL_TABLET | Freq: Four times a day (QID) | ORAL | Status: DC
  Administered 2019-06-03 – 2019-06-05 (×7): 600 mg via ORAL
  Filled 2019-06-03 (×7): qty 1

## 2019-06-03 MED ORDER — ZOLPIDEM TARTRATE 5 MG PO TABS: 5.0000 mg | ORAL_TABLET | Freq: Every evening | ORAL | Status: DC | PRN

## 2019-06-03 MED ORDER — DIBUCAINE (PERIANAL) 1 % EX OINT: 1.0000 "application " | TOPICAL_OINTMENT | CUTANEOUS | Status: DC | PRN

## 2019-06-03 MED ORDER — WITCH HAZEL-GLYCERIN EX PADS: 1.0000 "application " | MEDICATED_PAD | CUTANEOUS | Status: DC | PRN

## 2019-06-03 MED ORDER — OXYTOCIN 40 UNITS IN NORMAL SALINE INFUSION - SIMPLE MED
1.0000 m[IU]/min | INTRAVENOUS | Status: DC
  Administered 2019-06-03: 2 m[IU]/min via INTRAVENOUS
  Filled 2019-06-03: qty 1000

## 2019-06-03 MED ORDER — DIPHENHYDRAMINE HCL 50 MG/ML IJ SOLN: 12.5000 mg | INTRAMUSCULAR | Status: DC | PRN

## 2019-06-03 MED ORDER — EPHEDRINE 5 MG/ML INJ: 10.0000 mg | INTRAVENOUS | Status: DC | PRN

## 2019-06-03 MED ORDER — BENZOCAINE-MENTHOL 20-0.5 % EX AERO: 1.0000 "application " | INHALATION_SPRAY | CUTANEOUS | Status: DC | PRN

## 2019-06-03 MED ORDER — LIDOCAINE HCL (PF) 1 % IJ SOLN
INTRAMUSCULAR | Status: DC | PRN
  Administered 2019-06-03 (×2): 4 mL via EPIDURAL

## 2019-06-03 MED ORDER — ONDANSETRON HCL 4 MG PO TABS: 4.0000 mg | ORAL_TABLET | ORAL | Status: DC | PRN

## 2019-06-03 MED ORDER — PRENATAL MULTIVITAMIN CH
1.0000 | ORAL_TABLET | Freq: Every day | ORAL | Status: DC
  Administered 2019-06-04: 1 via ORAL
  Filled 2019-06-03: qty 1

## 2019-06-03 MED ORDER — COCONUT OIL OIL: 1.0000 "application " | TOPICAL_OIL | Status: DC | PRN

## 2019-06-03 NOTE — Progress Notes (Addendum)
Patient ID: Tracey Dunn, female   DOB: 10/23/1995, 23 y.o.   MRN: [id number]  Doing well after cytotec x 1 dose- reports some cramping; was sent from MFM for IOL due to elevated BPs during scheduled growth scan  BP 110/57, P 92 FHR 150s, +accels, no decels Ctx irreg 4-8 mins Cx 1/50/vtx -2, soft  CBGs 70-98  CBC    Component Value Date/Time   WBC 8.1 06/02/2019 1550   RBC 4.72 06/02/2019 1550   HGB 8.9 (L) 06/02/2019 1550   HGB 9.6 (L) 03/31/2019 0925   HCT 30.9 (L) 06/02/2019 1550   HCT 31.0 (L) 03/31/2019 0925   PLT 223 06/02/2019 1550   PLT 212 03/31/2019 0925   MCV 65.5 (L) 06/02/2019 1550   MCV 70 (L) 03/31/2019 0925   MCH 18.9 (L) 06/02/2019 1550   MCHC 28.8 (L) 06/02/2019 1550   RDW 18.9 (H) 06/02/2019 1550   RDW 15.8 (H) 03/31/2019 0925   LYMPHSABS 1.5 06/02/2019 1550   LYMPHSABS 2.4 01/06/2019 1014   MONOABS 0.3 06/02/2019 1550   EOSABS 0.0 06/02/2019 1550   EOSABS 0.1 01/06/2019 1014   BASOSABS 0.0 06/02/2019 1550   BASOSABS 0.0 01/06/2019 1014    IUP@37 .0wks gHTN GDMA1 GBS pos Anemia Cx unfavorable  Cervical foley and second dose of cytotec given vaginally Plan to start Pitocin when foley comes out Continue PCN as GBS ppx Consider TXA prior to delivery and po Fe postpartum Anticipate vag del  [name] 06/02/19

## 2019-06-03 NOTE — Anesthesia Preprocedure Evaluation (Signed)
Anesthesia Evaluation  Patient identified by MRN, date of birth, ID band  Reviewed: Allergy & Precautions, Patient's Chart, lab work & pertinent test results  History of Anesthesia Complications Negative for: history of anesthetic complications  Airway Mallampati: II  TM Distance: >3 FB Neck ROM: Full    Dental no notable dental hx.    Pulmonary sleep apnea ,    Pulmonary exam normal        Cardiovascular hypertension (gestational), Normal cardiovascular exam     Neuro/Psych negative neurological ROS  negative psych ROS   GI/Hepatic negative GI ROS, Neg liver ROS,   Endo/Other  diabetes, GestationalMorbid obesity  Renal/GU negative Renal ROS  negative genitourinary   Musculoskeletal negative musculoskeletal ROS (+)   Abdominal   Peds  Hematology  (+) anemia , Hgb 8.7   Anesthesia Other Findings Day of surgery medications reviewed with patient.  Reproductive/Obstetrics negative OB ROS                             Anesthesia Physical Anesthesia Plan  ASA: III  Anesthesia Plan: Epidural   Post-op Pain Management:    Induction:   PONV Risk Score and Plan: Treatment may vary due to age or medical condition  Airway Management Planned: Natural Airway  Additional Equipment:   Intra-op Plan:   Post-operative Plan:   Informed Consent: I have reviewed the patients History and Physical, chart, labs and discussed the procedure including the risks, benefits and alternatives for the proposed anesthesia with the patient or authorized representative who has indicated his/her understanding and acceptance.       Plan Discussed with:   Anesthesia Plan Comments:         Anesthesia Quick Evaluation

## 2019-06-03 NOTE — Discharge Summary (Signed)
Postpartum Discharge Summary     Patient Name: Tracey Dunn DOB: Sep 05, 1995 MRN: [id number]  Date of admission: 06/02/2019 Delivering Provider: Wende Mott   Date of discharge: 06/05/2019  Admitting diagnosis: Gestational hypertension [O13.9] Intrauterine pregnancy: [redacted]w[redacted]d    Secondary diagnosis:  Active Problems:   Anemia   Obesity affecting pregnancy, antepartum   Gestational diabetes mellitus (GDM)   GBS (group B Streptococcus carrier), +RV culture, currently pregnant   Gestational hypertension  Additional problems: None     Discharge diagnosis: Term Pregnancy Delivered, Gestational Hypertension and GDM A1                                                                                                Post partum procedures:None  Augmentation: AROM, Pitocin, Cytotec and Foley Balloon  Complications: None  Hospital course:  Induction of Labor With Vaginal Delivery   24y.o. yo G1P0000 at 322w1das admitted to the hospital 06/02/2019 for induction of labor.  Indication for induction: Gestational hypertension and A1 DM.  Patient had an uncomplicated labor course as follows: Membrane Rupture Time/Date: 9:38 AM ,06/03/2019   Intrapartum Procedures: Episiotomy: None [1]                                         Lacerations:  2nd degree [3]  Patient had delivery of a Viable infant.  Information for the patient's newborn:  BrOrelia, Brandstetter0[[id number]]Delivery Method: Vag-Spont    06/03/2019  Details of delivery can be found in separate delivery note.  Patient had a routine postpartum course. Patient with baseline anemia and subsequently dropped to Hgb 6.7 after delivery. Patient asymptomatic and refused IV iron, she is to continue PO iron on discharge. Fasting AM glucose 74; f/u with GTT outpatient. Patient is discharged home 06/05/19. Delivery time: 2:36 PM    Magnesium Sulfate received: No BMZ received: No Rhophylac:No MMR:No Transfusion:No  Physical exam  Vitals:    06/04/19 0554 06/04/19 1351 06/04/19 2100 06/05/19 0531  BP: (!) 104/58 129/87 128/77 103/73  Pulse: 88 94 (!) 102 93  Resp: 16 17 18 18   Temp: 98.2 F (36.8 C) 97.7 F (36.5 C) 98.3 F (36.8 C) 98.2 F (36.8 C)  TempSrc: Oral Oral  Oral  SpO2: 100%      General: alert, cooperative and no distress Lochia: appropriate Uterine Fundus: firm Incision: N/A DVT Evaluation: No evidence of DVT seen on physical exam. Labs: Lab Results  Component Value Date   WBC 12.2 (H) 06/04/2019   HGB 6.7 (LL) 06/04/2019   HCT 23.5 (L) 06/04/2019   MCV 66.8 (L) 06/04/2019   PLT 166 06/04/2019   CMP Latest Ref Rng & Units 06/02/2019  Glucose 70 - 99 mg/dL 84  BUN 6 - 20 mg/dL <5(L)  Creatinine 0.44 - 1.00 mg/dL 0.56  Sodium 135 - 145 mmol/L 137  Potassium 3.5 - 5.1 mmol/L 3.9  Chloride 98 - 111 mmol/L 109  CO2 22 - 32 mmol/L 17(L)  Calcium  8.9 - 10.3 mg/dL 8.8(L)  Total Protein 6.5 - 8.1 g/dL 6.3(L)  Total Bilirubin 0.3 - 1.2 mg/dL 0.8  Alkaline Phos 38 - 126 U/L 111  AST 15 - 41 U/L 19  ALT 0 - 44 U/L 13   Edinburgh Score: Edinburgh Postnatal Depression Scale Screening Tool 06/03/2019  I have been able to laugh and see the funny side of things. (No Data)    Discharge instruction: per After Visit Summary and "Baby and Me Booklet".  After visit meds:  Allergies as of 06/05/2019      Reactions   Sulfur Hives      Medication List    STOP taking these medications   Accu-Chek FastClix Lancets Misc   Accu-Chek Guide test strip Generic drug: glucose blood     TAKE these medications   acetaminophen 325 MG tablet Commonly known as: Tylenol Take 2 tablets (650 mg total) by mouth every 6 (six) hours as needed (for pain scale < 4). What changed:   medication strength  how much to take  reasons to take this   AMBULATORY NON FORMULARY MEDICATION 1 Device by Other route once a week. Blood pressure cuff/Large  Monitored Regularly at home ICD 10: Z34.90 LROB   budesonide 32  MCG/ACT nasal spray Commonly known as: RHINOCORT AQUA Place into the nose.   cetirizine 10 MG tablet Commonly known as: ZYRTEC Take 1 tablet (10 mg total) by mouth daily.   ferrous sulfate 325 (65 FE) MG tablet Take 1 tablet (325 mg total) by mouth every other day. Start taking on: June 06, 2019 What changed: when to take this   ibuprofen 600 MG tablet Commonly known as: ADVIL Take 1 tablet (600 mg total) by mouth every 6 (six) hours.   PRENATAL VITAMINS PO Take by mouth.   senna-docusate 8.6-50 MG tablet Commonly known as: Senokot-S Take 2 tablets by mouth daily. Start taking on: June 06, 2019       Diet: routine diet  Activity: Advance as tolerated. Pelvic rest for 6 weeks.   Outpatient follow up:4 weeks Follow up Appt: Future Appointments  Date Time Provider [facility]  06/05/2019  8:45 AM Bess Harvest, PTA [facility]  06/07/2019  8:45 AM June Leap, PT [facility]  06/08/2019  9:00 AM Lavonia Drafts, MD [facility] None  06/12/2019  8:45 AM Bess Harvest, PTA [facility]  06/14/2019  8:45 AM June Leap, PT [facility]  06/19/2019  8:45 AM Bess Harvest, PTA [facility]  06/21/2019  8:45 AM June Leap, PT [facility]   Follow up Visit:  Please schedule this patient for Postpartum visit in: 4 weeks with the following provider: Any provider In-Person For C/S patients schedule nurse incision check in weeks 2 weeks: no High risk pregnancy complicated by: HTN, K2IOX Delivery mode:  SVD Anticipated Birth Control:  POPs PP Procedures needed: BP Check, 2hr GTT  Schedule Integrated BH visit: no   Newborn Data: Live born female  Birth Weight: 3130g APGAR: 38, 9  Newborn Delivery   Birth date/time: 06/03/2019 14:36:00 Delivery type: Vaginal, Spontaneous      Baby Feeding: Breast Disposition:home with mother

## 2019-06-03 NOTE — Progress Notes (Signed)
Labor Progress Note Tracey Dunn is a 24 y.o. G1P0000 at [redacted]w[redacted]d presented for IOL gHTN  S:  Patient sleeping  O:  BP 121/77   Pulse 80   Temp 98.7 F (37.1 C) (Oral)   Resp 18   LMP 09/16/2018 (Exact Date)   SpO2 99%   Fetal Tracing:  Baseline: 140 Variability: moderate Accels: 15x15 Decels: none  Toco: irregular uc's   CVE: Dilation: 6 Effacement (%): 80 Cervical Position: Posterior Station: -2 Presentation: Vertex Exam by:: Lupita Raider CNM   A&P: 24 y.o. G1P0000 [redacted]w[redacted]d IOL gHTN #Labor: Discussed with patient risks and benefits of AROM for augmentation of labor. Patient agreeable to plan of care. AROM with large amount of clear fluid. Patient and FHR tolerated procedure well. Will continue to titrate pitocin. Consider IUPC if unable to trace ctx. #Pain: per patient request #FWB: Cat 1 #GBS positive  Rolm Bookbinder, CNM 9:56 AM

## 2019-06-03 NOTE — Progress Notes (Signed)
Patient ID: Ralonda Tartt, female   DOB: 1995/05/12, 24 y.o.   MRN: [id number]  Resting; had Fentanyl approx 30 mins earlier; cervical foley came out around MN-0100; Pit started at 0215; PCN x 3 doses  BPs 121/77, P 80 FHR 145-150, +accels, no decels Ctx q 2-4 mins with Pit at 97mu/min Cx deferred (was 6/80/-2 per RN exam)  IUP@37 .1wks gHTN IOL process GBS pos  Continue to titrate Pit to achieve adequate labor Anticipate vag del  [name] [facility] 06/03/2019

## 2019-06-03 NOTE — Anesthesia Procedure Notes (Signed)
Epidural Patient location during procedure: OB Start time: 06/03/2019 10:26 AM End time: 06/03/2019 10:31 AM  Staffing Anesthesiologist: Kaylyn Layer, MD Performed: anesthesiologist   Preanesthetic Checklist Completed: patient identified, IV checked, risks and benefits discussed, monitors and equipment checked, pre-op evaluation and timeout performed  Epidural Patient position: sitting Prep: DuraPrep and site prepped and draped Patient monitoring: continuous pulse ox, blood pressure and heart rate Approach: midline Location: L3-L4 Injection technique: LOR air  Needle:  Needle type: Tuohy  Needle gauge: 17 G Needle length: 15 cm Catheter type: closed end flexible Catheter size: 19 Gauge Catheter at skin depth: 15 cm Test dose: negative and Other (1% lidocaine)  Assessment Events: blood not aspirated, injection not painful, no injection resistance, no paresthesia and negative IV test  Additional Notes Patient identified. Risks, benefits, and alternatives discussed with patient including but not limited to bleeding, infection, nerve damage, paralysis, failed block, incomplete pain control, headache, blood pressure changes, nausea, vomiting, reactions to medication, itching, and postpartum back pain. Confirmed with bedside nurse the patient's most recent platelet count. Confirmed with patient that they are not currently taking any anticoagulation, have any bleeding history, or any family history of bleeding disorders. Patient expressed understanding and wished to proceed. All questions were answered. Sterile technique was used throughout the entire procedure. Please see nursing notes for vital signs.   Two attempts at L3-4, first with 10cm Tuohy unable to access due to insufficient depth. Switched to 15cm needle with crisp LOR at 11cm. Test dose was given through epidural catheter and negative prior to continuing to dose epidural or start infusion. Warning signs of high block given  to the patient including shortness of breath, tingling/numbness in hands, complete motor block, or any concerning symptoms with instructions to call for help. Patient was given instructions on fall risk and not to get out of bed. All questions and concerns addressed with instructions to call with any issues or inadequate analgesia.  Reason for block:procedure for pain

## 2019-06-04 ENCOUNTER — Encounter (HOSPITAL_COMMUNITY): Payer: Self-pay | Admitting: Obstetrics & Gynecology

## 2019-06-04 LAB — CBC
HCT: 23.5 % — ABNORMAL LOW (ref 36.0–46.0)
Hemoglobin: 6.7 g/dL — CL (ref 12.0–15.0)
MCH: 19 pg — ABNORMAL LOW (ref 26.0–34.0)
MCHC: 28.5 g/dL — ABNORMAL LOW (ref 30.0–36.0)
MCV: 66.8 fL — ABNORMAL LOW (ref 80.0–100.0)
Platelets: 166 10*3/uL (ref 150–400)
RBC: 3.52 MIL/uL — ABNORMAL LOW (ref 3.87–5.11)
RDW: 18.9 % — ABNORMAL HIGH (ref 11.5–15.5)
WBC: 12.2 10*3/uL — ABNORMAL HIGH (ref 4.0–10.5)
nRBC: 0 % (ref 0.0–0.2)

## 2019-06-04 LAB — GLUCOSE, CAPILLARY: Glucose-Capillary: 74 mg/dL (ref 70–99)

## 2019-06-04 MED ORDER — FERROUS SULFATE 325 (65 FE) MG PO TABS
325.0000 mg | ORAL_TABLET | ORAL | Status: DC
  Administered 2019-06-04: 325 mg via ORAL
  Filled 2019-06-04: qty 1

## 2019-06-04 NOTE — Anesthesia Postprocedure Evaluation (Signed)
Anesthesia Post Note  Patient: Tracey Dunn  Procedure(s) Performed: AN AD HOC LABOR EPIDURAL     Patient location during evaluation: Mother Baby Anesthesia Type: Epidural Level of consciousness: awake and alert Pain management: pain level controlled Vital Signs Assessment: post-procedure vital signs reviewed and stable Respiratory status: spontaneous breathing, nonlabored ventilation and respiratory function stable Cardiovascular status: stable Postop Assessment: no headache, no backache and epidural receding Anesthetic complications: no    Last Vitals:  Vitals:   06/04/19 0119 06/04/19 0554  BP: (!) 110/57 (!) 104/58  Pulse: 82 88  Resp: 20 16  Temp:  36.8 C  SpO2: 98% 100%    Last Pain:  Vitals:   06/04/19 0748  TempSrc:   PainSc: 0-No pain   Pain Goal: Patients Stated Pain Goal: 2 (06/03/19 2148)                 Minerva Areola

## 2019-06-04 NOTE — Progress Notes (Addendum)
Post Partum Day 1 Subjective: no complaints, up ad lib, voiding, tolerating PO and + flatus. Denies dizziness when resting/getting up/walking.  Objective: Blood pressure (!) 104/58, pulse 88, temperature 98.2 F (36.8 C), temperature source Oral, resp. rate 16, last menstrual period 09/16/2018, SpO2 100 %, unknown if currently breastfeeding.  Physical Exam:  General: alert, cooperative, appears stated age and no distress Lochia: appropriate Uterine Fundus: firm Incision: n/a DVT Evaluation: No evidence of DVT seen on physical exam. No cords or calf tenderness. No significant calf/ankle edema.  Recent Labs    06/03/19 0942 06/04/19 0459  HGB 8.7* 6.7*  HCT 30.4* 23.5*   Fasting glucose: 74  Assessment/Plan: Plan for discharge tomorrow and Contraception plan to have POPs. Patient anemic to 8.7 to begin with, appropriate drop in hgb to 6.7. Patient is currently asymptomatic and IV already removed. Will give PO iron with precautions to let RN know if any symptoms develop.  Fasting glucose appropriate.   LOS: 2 days   Shirlean Mylar 06/04/2019, 8:45 AM   I saw and evaluated the patient. I agree with the findings and the plan of care as documented in the resident's note.  Jerilynn Birkenhead, MD [facility] Family Medicine Fellow, [facility] for [employer], [facility]

## 2019-06-04 NOTE — Lactation Note (Signed)
This note was copied from a baby's chart. Lactation Consultation Note  Patient Name: Tracey Dunn BHALP'F Date: 06/04/2019 Reason for consult: Initial assessment;Early term 37-38.6wks;Primapara;1st time breastfeeding  P1 mother whose infant is now 34 hours old.  This is an ETI at 37+1 weeks.  Mother's feeding preference is to pump and bottle feed.  She is not interested in latching baby to the breast.  Baby was asleep in the bassinet when I arrived.  It had been three hours since his last feeding.  Reviewed the LPTI guideline sheet with mother.  Offered to help awaken him and demonstrate feeding and mother accepted.  Demonstrated paced bottle feeding.  Baby is using the gold slow flow nipple.  Taught mother effective burping and how to gently awaken him to feed.  Showed mother how to initiate sucking when he remains sleepy.  With guidance, mother was able to bottle feed effectively.  Demonstrated burping and suggested she burp him frequently.    Initiated the DEBP for mother.  Asked her to pump for 15 minutes after every feeding session and to feed back any EBM she obtains first prior to formula supplementation.  Colostrum container provided and milk storage times reviewed.  Finger feeding demonstrated.  Pump parts, assembly, disassembly and cleaning reviewed.  Observed mother pumping for 15 minutes and no colostrum noted at this time.  #24 flange size is appropriate.  Mother will call for any further questions/concerns related to pumping or bottle feeding.    Mother has a DEBP for home use.  Father will return later today.      Maternal Data Formula Feeding for Exclusion: No Has patient been taught Hand Expression?: Yes Does the patient have breastfeeding experience prior to this delivery?: No  Feeding Feeding Type: Formula Nipple Type: Slow - flow  LATCH Score                   Interventions    Lactation Tools Discussed/Used Tools: Pump;Bottle Breast pump type:  Double-Electric Breast Pump Pump Review: Setup, frequency, and cleaning;Milk Storage Initiated by:: Amaliya Whitelaw Date initiated:: 06/04/19   Consult Status Consult Status: Follow-up Date: 06/05/19 Follow-up type: In-patient    Dora Sims 06/04/2019, 4:43 PM

## 2019-06-04 NOTE — Progress Notes (Signed)
Called by lab at [phone] for critical hemoglobin of 6.7. Pt asymptomatic. Notified Dr. Morene Antu at [phone]. No new orders at this time.

## 2019-06-05 ENCOUNTER — Encounter (HOSPITAL_COMMUNITY): Payer: Self-pay | Admitting: Obstetrics & Gynecology

## 2019-06-05 ENCOUNTER — Ambulatory Visit: Payer: Medicaid Other

## 2019-06-05 MED ORDER — SENNOSIDES-DOCUSATE SODIUM 8.6-50 MG PO TABS: 2.0000 | ORAL_TABLET | ORAL | 0 refills | Status: DC

## 2019-06-05 MED ORDER — FERROUS SULFATE 325 (65 FE) MG PO TABS: 325.0000 mg | ORAL_TABLET | ORAL | 0 refills | Status: DC

## 2019-06-05 MED ORDER — ACETAMINOPHEN 325 MG PO TABS: 650.0000 mg | ORAL_TABLET | Freq: Four times a day (QID) | ORAL | 0 refills | Status: DC | PRN

## 2019-06-05 MED ORDER — IBUPROFEN 600 MG PO TABS: 600.0000 mg | ORAL_TABLET | Freq: Four times a day (QID) | ORAL | 0 refills | Status: DC

## 2019-06-05 NOTE — Lactation Note (Signed)
This note was copied from a baby's chart. Lactation Consultation Note  Patient Name: Tracey Dunn ZZCKI'C Date: 06/05/2019 Reason for consult: Follow-up assessment;Primapara;1st time breastfeeding;Other (Comment);Infant weight loss;Early term 37-38.6wks(mom plans to pump and bottle feed - has a DEBP at home) Pecola Leisure is 34 hours old  LC reviewed doc flow sheets with mom and updated.  pe rmom has pumped x 3 in the last 24 hours with EEBP.  Has a DEBP at home , not sure the type.  LC reviewed supply and demand and the importance of pumping consistently around the clock 8-10 times both breast for 15 -20 mins , S/S of mastitis, sore nipple and engorgement prevention and tx., storage of breast milk.  LC encouraged mom to call if  questions and concerns.    Maternal Data    Feeding Feeding Type: (baby recently fed per mom)  LATCH Score                   Interventions Interventions: Breast feeding basics reviewed;DEBP  Lactation Tools Discussed/Used Tools: Pump Breast pump type: Double-Electric Breast Pump Pump Review: Milk Storage   Consult Status Consult Status: Complete Date: 06/05/19    Tracey Dunn 06/05/2019, 9:13 AM

## 2019-06-07 ENCOUNTER — Encounter: Payer: Medicaid Other | Admitting: Physical Therapy

## 2019-06-08 ENCOUNTER — Encounter: Payer: Medicaid Other | Admitting: Obstetrics & Gynecology

## 2019-06-14 ENCOUNTER — Encounter: Payer: Self-pay | Admitting: Obstetrics & Gynecology

## 2019-06-14 ENCOUNTER — Encounter: Payer: Medicaid Other | Admitting: Physical Therapy

## 2019-06-21 ENCOUNTER — Encounter: Payer: Medicaid Other | Admitting: Physical Therapy

## 2019-07-06 ENCOUNTER — Other Ambulatory Visit: Payer: Self-pay

## 2019-07-06 ENCOUNTER — Other Ambulatory Visit (HOSPITAL_COMMUNITY)
Admission: RE | Admit: 2019-07-06 | Discharge: 2019-07-06 | Disposition: A | Discharge status: Home / Self Care | Payer: Medicaid Other | Source: Ambulatory Visit | Attending: Family Medicine | Admitting: Family Medicine

## 2019-07-06 ENCOUNTER — Ambulatory Visit (INDEPENDENT_AMBULATORY_CARE_PROVIDER_SITE_OTHER): Payer: Medicaid Other | Admitting: Family Medicine

## 2019-07-06 ENCOUNTER — Encounter: Payer: Self-pay | Admitting: Family Medicine

## 2019-07-06 VITALS — BP 122/74 | HR 86 | Ht 65.0 in | Wt 242.0 lb

## 2019-07-06 DIAGNOSIS — Z01812 Encounter for preprocedural laboratory examination: Secondary | ICD-10-CM

## 2019-07-06 DIAGNOSIS — R87612 Low grade squamous intraepithelial lesion on cytologic smear of cervix (LGSIL): Secondary | ICD-10-CM

## 2019-07-06 DIAGNOSIS — N898 Other specified noninflammatory disorders of vagina: Secondary | ICD-10-CM

## 2019-07-06 DIAGNOSIS — Z30011 Encounter for initial prescription of contraceptive pills: Secondary | ICD-10-CM

## 2019-07-06 DIAGNOSIS — O133 Gestational [pregnancy-induced] hypertension without significant proteinuria, third trimester: Secondary | ICD-10-CM

## 2019-07-06 DIAGNOSIS — O24430 Gestational diabetes mellitus in the puerperium, diet controlled: Secondary | ICD-10-CM

## 2019-07-06 DIAGNOSIS — O24410 Gestational diabetes mellitus in pregnancy, diet controlled: Secondary | ICD-10-CM

## 2019-07-06 LAB — POCT URINE PREGNANCY: Preg Test, Ur: NEGATIVE

## 2019-07-06 MED ORDER — NORETHIN ACE-ETH ESTRAD-FE 1-20 MG-MCG(24) PO TABS: 1.0000 | ORAL_TABLET | Freq: Every day | ORAL | 3 refills | Status: DC

## 2019-07-06 NOTE — Progress Notes (Signed)
Subjective:     Tracey Dunn is a 24 y.o. female who presents for a postpartum visit. She is 5 weeks postpartum following a spontaneous vaginal delivery. I have fully reviewed the prenatal and intrapartum course. The delivery was at 37 gestational weeks. Outcome: spontaneous vaginal delivery. Anesthesia: epidural. Postpartum course has been normal. Baby's course has been normal. Baby is feeding by bottle Rush Barer Soothe. Bleeding staining only. Bowel function is normal. Bladder function is normal. Patient is not sexually active. Contraception method is OCP (estrogen/progesterone). Postpartum depression screening: negative.  The following portions of the patient's history were reviewed and updated as appropriate: allergies, current medications, past family history, past medical history, past social history, past surgical history and problem list.  Review of Systems Pertinent items are noted in HPI.   Objective:   BP 122/74   Pulse 86   Ht 5\' 5"  (1.651 m)   Wt 242 lb (109.8 kg)   LMP 09/16/2018 (Exact Date)   BMI 40.27 kg/m    LMP 09/16/2018 (Exact Date)   General:  alert, cooperative and no distress  Lungs: clear to auscultation bilaterally  Heart:  regular rate and rhythm, S1, S2 normal, no murmur, click, rub or gallop  Abdomen: soft, non-tender; bowel sounds normal; no masses,  no organomegaly   Vulva:  normal  Vagina: thin white discharge        Assessment:     Normal postpartum exam. GDM, GHTN. Pap smear not done at today's visit.   Plan:    1. Contraception: OCP (estrogen/progesterone) 2. Will return for 2hr GTT 3. BP normal off medications. 4. LSIL - needs PAP in 6 months. 5. Follow up in: 6 months or as needed.

## 2019-07-10 ENCOUNTER — Other Ambulatory Visit: Payer: Self-pay | Admitting: Family Medicine

## 2019-07-10 LAB — CERVICOVAGINAL ANCILLARY ONLY
Bacterial Vaginitis (gardnerella): POSITIVE — AB
Candida Glabrata: NEGATIVE
Candida Vaginitis: NEGATIVE
Comment: NEGATIVE
Comment: NEGATIVE
Comment: NEGATIVE

## 2019-07-10 MED ORDER — METRONIDAZOLE 500 MG PO TABS: 500.0000 mg | ORAL_TABLET | Freq: Two times a day (BID) | ORAL | 0 refills | Status: DC

## 2019-07-19 ENCOUNTER — Other Ambulatory Visit: Payer: Self-pay

## 2019-07-19 ENCOUNTER — Other Ambulatory Visit: Payer: Medicaid Other

## 2019-11-08 IMAGING — US US MFM OB DETAIL+14 WK
1 series · 13 of 28 positions shown · non-contrast
Comparison: none

[Series 1: us mfm ob detail+14 wk · 122 acquisitions, 13 frames shown]
[im 5/122]
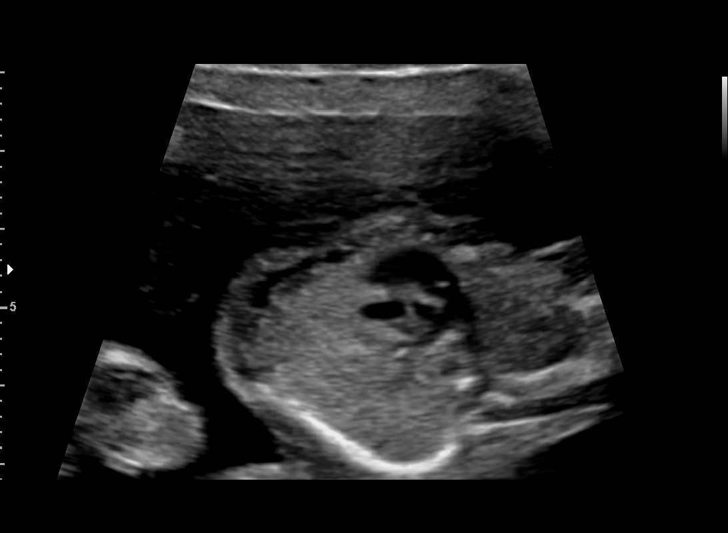
[im 14/122]
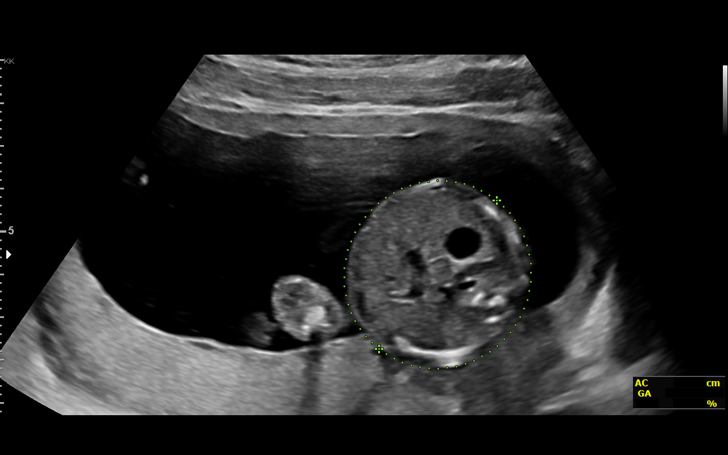
[im 23/122]
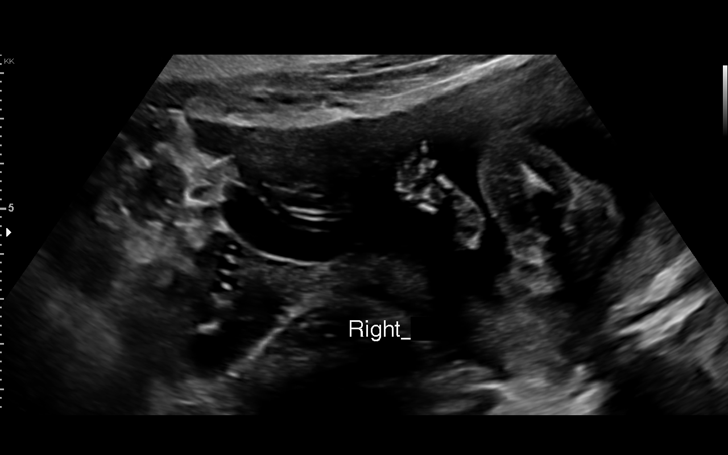
[im 32/122]
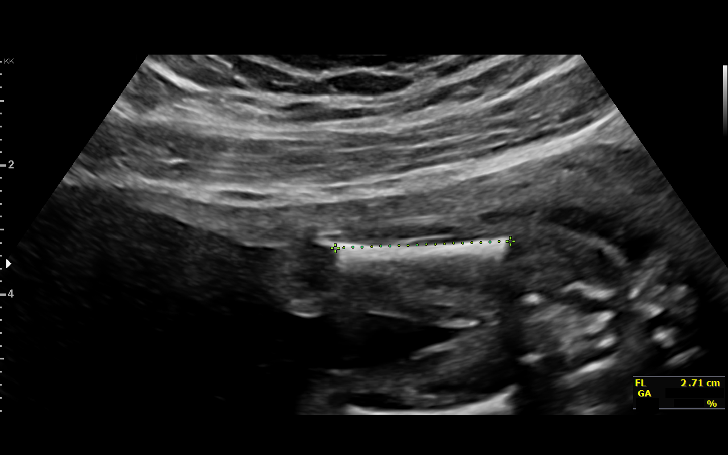
[im 41/122]
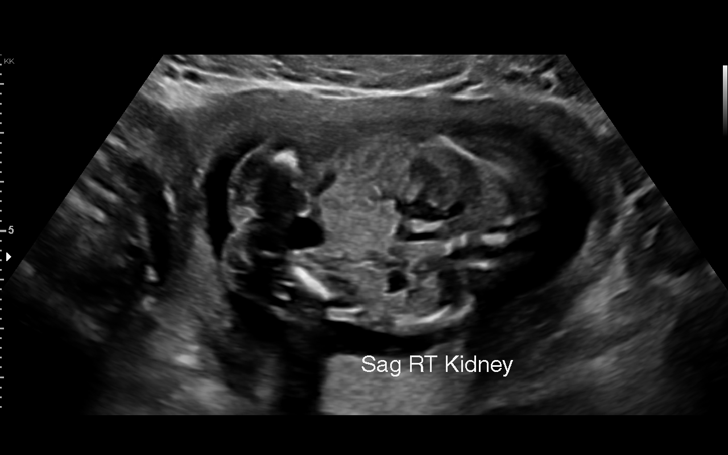
[im 50/122]
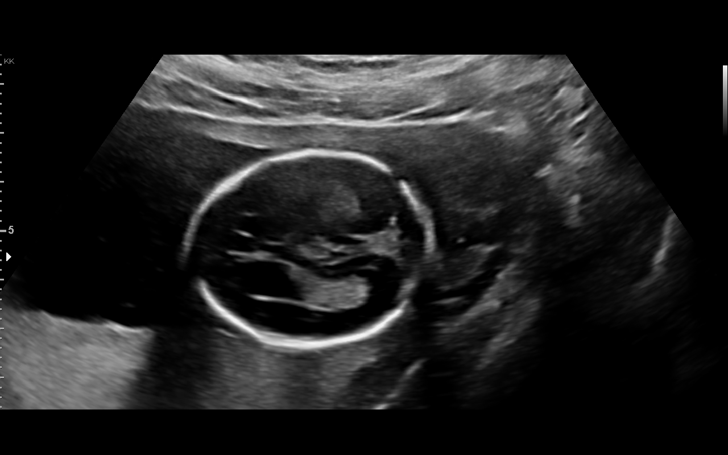
[im 63/122]
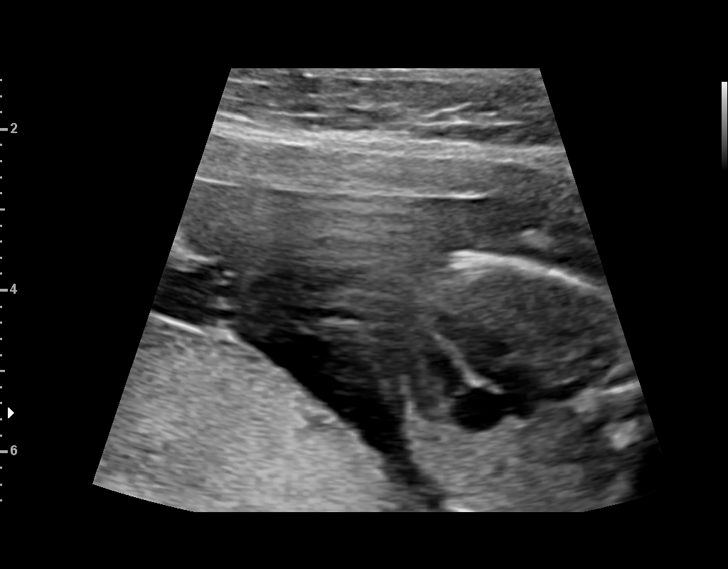
[im 72/122]
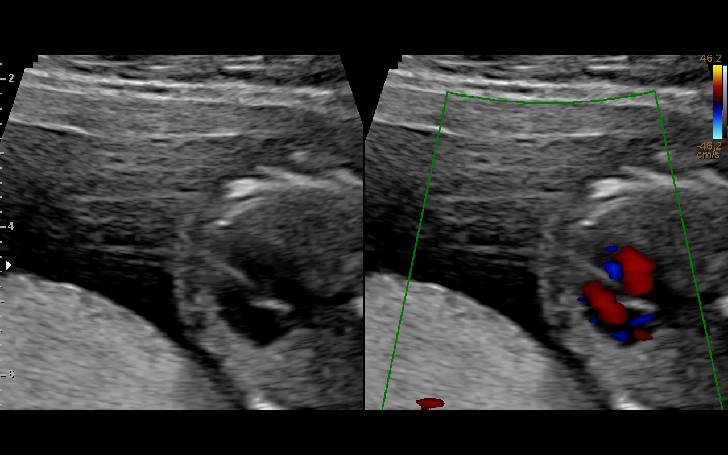
[im 81/122]
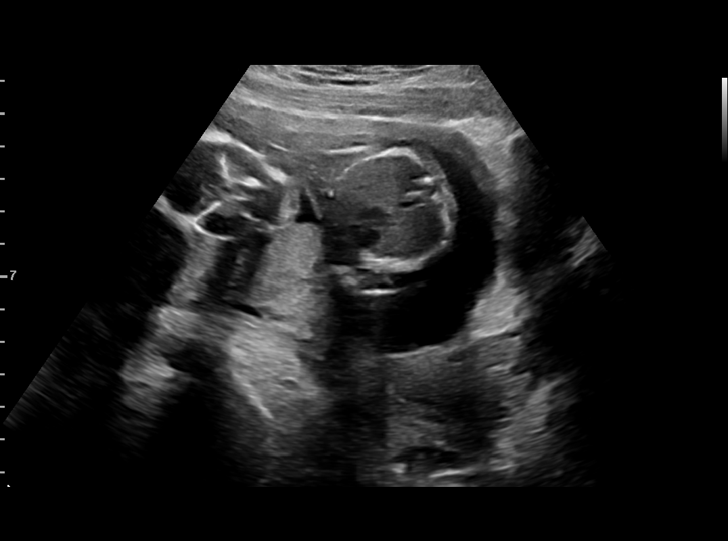
[im 90/122]
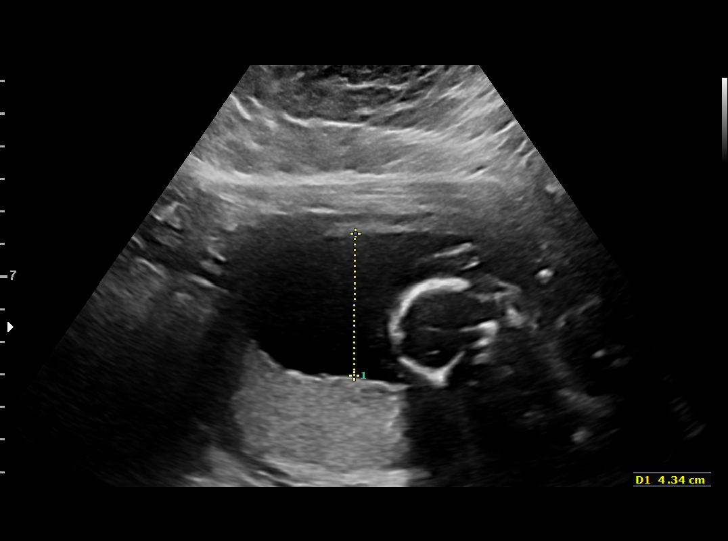
[im 99/122]
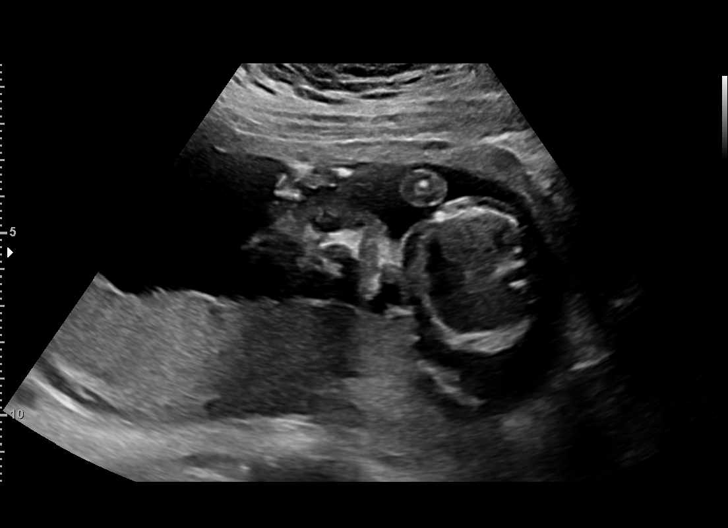
[im 108/122]
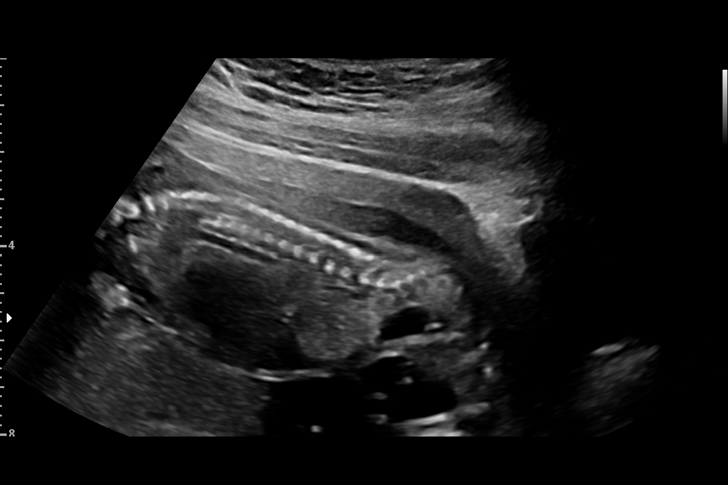
[im 117/122]
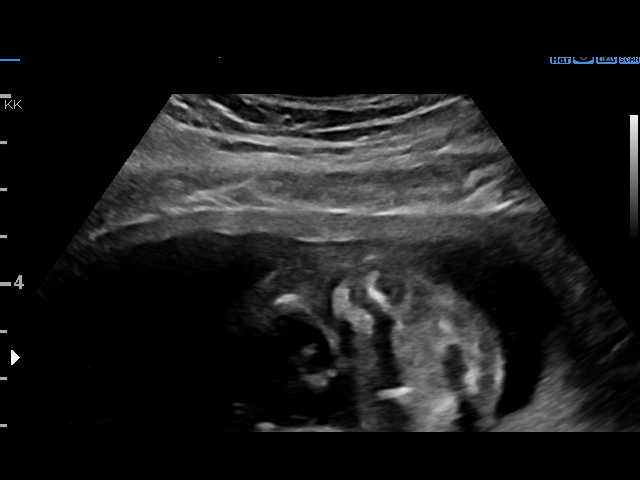

[13 of 28 positions shown; findings below may reference images not displayed]

AUJLA

                                                       LAO
 ----------------------------------------------------------------------

 ----------------------------------------------------------------------
Indications

  Encounter for antenatal screening for
  malformations
  Obesity complicating pregnancy, second
  trimester(BMI 43)
  Anemia during pregnancy in second trimester
  19 weeks gestation of pregnancy
  Genetic carrier (Silent Carrier for Alpha
  Thalassemia)(Negative AFP)( Low Risk
  NIPS)
 ----------------------------------------------------------------------
Vital Signs

 BMI:
Fetal Evaluation

 Num Of Fetuses:         1
 Fetal Heart Rate(bpm):  151
 Cardiac Activity:       Observed
 Presentation:           Breech
 Placenta:               Posterior
 P. Cord Insertion:      Visualized

 Amniotic Fluid
 AFI FV:      Within normal limits

                             Largest Pocket(cm)

Biometry

 BPD:        39  mm     G. Age:  17w 6d         10  %    CI:        71.31   %    70 - 86
                                                         FL/HC:      18.4   %    16.1 -
 HC:      147.1  mm     G. Age:  17w 6d          4  %    HC/AC:      1.06        1.09 -
 AC:      138.5  mm     G. Age:  19w 2d         55  %    FL/BPD:     69.2   %
 FL:         27  mm     G. Age:  18w 2d         18  %    FL/AC:      19.5   %    20 - 24
 HUM:      27.6  mm     G. Age:  18w 6d         46  %
 NFT:       3.3  mm

 Est. FW:     251  gm      0 lb 9 oz     27  %
OB History

 Gravidity:    1
Gestational Age

 LMP:           19w 0d        Date:  09/16/18                 EDD:   06/23/19
 U/S Today:     18w 2d                                        EDD:   06/28/19
 Best:          19w 0d     Det. By:  LMP  (09/16/18)          EDD:   06/23/19
Anatomy

 Cranium:               Appears normal         LVOT:                   Appears normal
 Cavum:                 Appears normal         Aortic Arch:            Appears normal
 Ventricles:            Appears normal         Ductal Arch:            Appears normal
 Choroid Plexus:        Appears normal         Diaphragm:              Not well visualized
 Cerebellum:            Appears normal         Stomach:                Appears normal, left
                                                                       sided
 Posterior Fossa:       Appears normal         Abdomen:                Appears normal
 Nuchal Fold:           Appears normal         Abdominal Wall:         Appears nml (cord
                                                                       insert, abd wall)
 Face:                  Appears normal         Cord Vessels:           Appears normal (3
                        (orbits and profile)                           vessel cord)
 Lips:                  Appears normal         Kidneys:                Appear normal
 Palate:                Not well visualized    Bladder:                Appears normal
 Thoracic:              Appears normal         Spine:                  Appears normal
 Heart:                 Appears normal         Upper Extremities:      Appears normal
                        (4CH, axis, and
                        situs)
 RVOT:                  Appears normal         Lower Extremities:      Appears normal

 Other:  Male gender. Heels visualized. Technically difficult due to maternal
         habitus and fetal position.
Cervix Uterus Adnexa

 Cervix
 Length:            3.8  cm.
 Normal appearance by transabdominal scan.

 Adnexa
 No abnormality visualized.
Impression

 We performed fetal anatomy scan. No makers of
 aneuploidies or fetal structural defects are seen. Fetal
 biometry is consistent with her previously-established dates.
 Amniotic fluid is normal and good fetal activity is seen.
 Patient understands the limitations of ultrasound in detecting
 fetal anomalies.

 On cell-free fetal DNA screening, the risks of fetal
 aneuploidies are not increased. MSAFP screening showed
 low risk for open-neural tube defects.
Recommendations

 -An appointment was made for her to return in 4 weeks for
 completion of fetal anatomy.
                 Murakami, Claudio Jose

## 2019-11-13 ENCOUNTER — Other Ambulatory Visit: Payer: Self-pay

## 2019-11-13 ENCOUNTER — Encounter (HOSPITAL_BASED_OUTPATIENT_CLINIC_OR_DEPARTMENT_OTHER): Payer: Self-pay

## 2019-11-13 DIAGNOSIS — I951 Orthostatic hypotension: Secondary | ICD-10-CM | POA: Insufficient documentation

## 2019-11-13 DIAGNOSIS — R42 Dizziness and giddiness: Secondary | ICD-10-CM | POA: Diagnosis not present

## 2019-11-13 DIAGNOSIS — R072 Precordial pain: Secondary | ICD-10-CM | POA: Insufficient documentation

## 2019-11-13 NOTE — ED Triage Notes (Signed)
Pt had sudden onset of chest tightness with pain to the L arm, blurred vision, and ShOB.

## 2019-11-13 NOTE — ED Notes (Signed)
Per Zella Ball, NT she advised this RN that after pt had her blood drawn pt got out of the chair and onto her knees in the floor and pt sat in the floor stating she feels weak. This RN assessed pt and noted generalized weakness. V/S obtained and pt assisted to wheel chair.

## 2019-11-14 ENCOUNTER — Emergency Department (HOSPITAL_BASED_OUTPATIENT_CLINIC_OR_DEPARTMENT_OTHER)
Admission: EM | Admit: 2019-11-14 | Discharge: 2019-11-14 | Disposition: A | Discharge status: Home / Self Care | Payer: Medicaid Other | Attending: Emergency Medicine | Admitting: Emergency Medicine

## 2019-11-14 ENCOUNTER — Emergency Department (HOSPITAL_BASED_OUTPATIENT_CLINIC_OR_DEPARTMENT_OTHER): Payer: Medicaid Other

## 2019-11-14 DIAGNOSIS — R072 Precordial pain: Secondary | ICD-10-CM

## 2019-11-14 DIAGNOSIS — I951 Orthostatic hypotension: Secondary | ICD-10-CM

## 2019-11-14 LAB — TROPONIN I (HIGH SENSITIVITY): Troponin I (High Sensitivity): <2 ng/L (ref <18)

## 2019-11-14 LAB — BASIC METABOLIC PANEL
Anion gap: 14 (ref 5–15)
BUN: 12 mg/dL (ref 6–20)
CO2: 23 mmol/L (ref 22–32)
Calcium: 9.2 mg/dL (ref 8.9–10.3)
Chloride: 102 mmol/L (ref 98–111)
Creatinine, Ser: 0.96 mg/dL (ref 0.44–1.00)
GFR calc Af Amer: >60 mL/min (ref >60)
GFR calc non Af Amer: >60 mL/min (ref >60)
Glucose, Bld: 100 mg/dL — ABNORMAL HIGH (ref 70–99)
Potassium: 3.6 mmol/L (ref 3.5–5.1)
Sodium: 139 mmol/L (ref 135–145)

## 2019-11-14 LAB — CBC
HCT: 35.1 % — ABNORMAL LOW (ref 36.0–46.0)
Hemoglobin: 10 g/dL — ABNORMAL LOW (ref 12.0–15.0)
MCH: 18.7 pg — ABNORMAL LOW (ref 26.0–34.0)
MCHC: 28.5 g/dL — ABNORMAL LOW (ref 30.0–36.0)
MCV: 65.7 fL — ABNORMAL LOW (ref 80.0–100.0)
Platelets: 250 10*3/uL (ref 150–400)
RBC: 5.34 MIL/uL — ABNORMAL HIGH (ref 3.87–5.11)
RDW: 20.1 % — ABNORMAL HIGH (ref 11.5–15.5)
WBC: 7.6 10*3/uL (ref 4.0–10.5)
nRBC: 0 % (ref 0.0–0.2)

## 2019-11-14 LAB — PREGNANCY, URINE: Preg Test, Ur: NEGATIVE

## 2019-11-14 MED ORDER — LACTATED RINGERS IV BOLUS: 2000.0000 mL | Freq: Once | INTRAVENOUS | Status: DC

## 2019-11-14 NOTE — ED Provider Notes (Signed)
[facility] EMERGENCY DEPARTMENT Provider Note   CSN: [id number] Arrival date & time: 11/13/19  2321     History Chief Complaint  Patient presents with  . Chest Pain    Tracey Dunn is a 24 y.o. female.  The history is provided by the patient.  Chest Pain Pain location:  Substernal area Pain quality: pressure and tightness   Pain radiates to:  L arm Pain severity:  Severe Timing:  Intermittent Progression:  Unchanged Chronicity:  New Associated symptoms: shortness of breath   Associated symptoms: no fever and no vomiting    Patient had sudden onset of chest pain his pain to left arm.  She also reported feeling lightheaded and blurred vision.  She also reported shortness of breath. Denies any known cardiac or pulmonary issues. No history of CAD/VTE.  HPI: A 24 year old patient with a history of obesity presents for evaluation of chest pain. Initial onset of pain was less than one hour ago. The patient's chest pain is described as heaviness/pressure/tightness and is not worse with exertion. The patient's chest pain is middle- or left-sided, is not well-localized, is not sharp and does radiate to the arms/jaw/neck. The patient does not complain of nausea and denies diaphoresis. The patient has no history of stroke, has no history of peripheral artery disease, has not smoked in the past 90 days, denies any history of treated diabetes, has no relevant family history of coronary artery disease (first degree relative at less than age 80), is not hypertensive and has no history of hypercholesterolemia.   Past Medical History:  Diagnosis Date  . Anemia     Patient Active Problem List   Diagnosis Date Noted  . Alpha thalassemia silent carrier 02/07/2019  . 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 Obesity affecting pregnancy, antepartum 01/06/2019  . Overweight 09/23/2017  . OSA (obstructive sleep apnea) 09/10/2016  . Snoring 09/10/2016  . Anemia 04/01/2015  .  Menorrhagia 04/01/2015  . Obesity due to excess calories 04/01/2015    Past Surgical History:  Procedure Laterality Date  . TONSILLECTOMY    . WISDOM TOOTH EXTRACTION       OB History    Gravida  1   Para  1   Term  1   Preterm  0   AB  0   Living  1     SAB  0   TAB  0   Ectopic  0   Multiple  0   Live Births  1           Family History  Problem Relation Age of Onset  . Hypertension Mother   . Diabetes Mother        Type 1  . Hypertension Maternal Grandmother   . Cancer Neg Hx   . Stroke Neg Hx     Social History   Tobacco Use  . Smoking status: Never Smoker  . Smokeless tobacco: Never Used  Vaping Use  . Vaping Use: Never used  Substance Use Topics  . Alcohol use: Yes    Comment: occ  . Drug use: No    Home Medications Prior to Admission medications   Medication Sig Start Date End Date Taking? Authorizing Provider  AMBULATORY NON FORMULARY MEDICATION 1 Device by Other route once a week. Blood pressure cuff/Large  Monitored Regularly at home ICD 10: Z34.90 LROB Patient not taking: Reported on 07/06/2019 01/06/19   [name], MD  budesonide (RHINOCORT AQUA) 32 MCG/ACT nasal spray Place  into the nose. 09/10/16   [provider]  ibuprofen (ADVIL) 600 MG tablet Take 1 tablet (600 mg total) by mouth every 6 (six) hours. 06/05/19   Fair, Hoyle Sauer, MD  Norethindrone Acetate-Ethinyl Estrad-FE (LOESTRIN 24 FE) 1-20 MG-MCG(24) tablet Take 1 tablet by mouth daily. 07/06/19   Levie Heritage, DO  Prenatal Vit-Fe Fumarate-FA (PRENATAL VITAMINS PO) Take by mouth.    [provider]  cetirizine (ZYRTEC) 10 MG tablet Take 1 tablet (10 mg total) by mouth daily. Patient not taking: Reported on 07/06/2019 02/13/19 11/14/19  Levie Heritage, DO  ferrous sulfate 325 (65 FE) MG tablet Take 1 tablet (325 mg total) by mouth every other day. Patient not taking: Reported on 07/06/2019 06/06/19 11/14/19  Joselyn Arrow, MD    Allergies     Sulfur  Review of Systems   Review of Systems  Constitutional: Negative for fever.  Respiratory: Positive for shortness of breath.   Cardiovascular: Positive for chest pain.  Gastrointestinal: Negative for vomiting.  Neurological: Positive for light-headedness.  All other systems reviewed and are negative.   Physical Exam Updated Vital Signs BP 108/74   Pulse 75   Temp 99.6 F (37.6 C) (Oral)   Resp 17   Ht 1.651 m (5\' 5" )   Wt (!) 115.7 kg   LMP 10/30/2019   SpO2 100%   BMI 42.45 kg/m   Physical Exam CONSTITUTIONAL: Well developed/well nourished HEAD: Normocephalic/atraumatic EYES: EOMI/PERRL ENMT: Mucous membranes moist NECK: supple no meningeal signs SPINE/BACK:entire spine nontender CV: S1/S2 noted, no murmurs/rubs/gallops noted LUNGS: Lungs are clear to auscultation bilaterally, no apparent distress Chest - no chest wall tenderness ABDOMEN: soft, nontender, no rebound or guarding, bowel sounds noted throughout abdomen GU:no cva tenderness NEURO: Pt is awake/alert/appropriate, moves all extremitiesx4.  No facial droop.  No arm drift. EXTREMITIES: pulses normal/equalx4, full ROM, no calf tenderness or edema SKIN: warm, color normal PSYCH: poor eye contact, anxious  ED Results / Procedures / Treatments   Labs (all labs ordered are listed, but only abnormal results are displayed) Labs Reviewed  BASIC METABOLIC PANEL - Abnormal; Notable for the following components:      Result Value   Glucose, Bld 100 (*)    All other components within normal limits  CBC - Abnormal; Notable for the following components:   RBC 5.34 (*)    Hemoglobin 10.0 (*)    HCT 35.1 (*)    MCV 65.7 (*)    MCH 18.7 (*)    MCHC 28.5 (*)    RDW 20.1 (*)    All other components within normal limits  PREGNANCY, URINE  TROPONIN I (HIGH SENSITIVITY)    EKG EKG Interpretation  Date/Time:  Monday November 13 2019 23:28:04 EDT Ventricular Rate:  100 PR Interval:  126 QRS Duration: 86 QT  Interval:  336 QTC Calculation: 433 R Axis:   40 Text Interpretation: Normal sinus rhythm Nonspecific T wave abnormality Abnormal ECG No significant change since last tracing Confirmed by [id number] (Zadie Rhine) on 11/14/2019 1:57:10 AM   Radiology DG Chest 2 View  Result Date: 11/14/2019 CLINICAL DATA:  Sudden onset of chest pain and shortness of breath EXAM: CHEST - 2 VIEW COMPARISON:  06/30/2018 FINDINGS: The heart size and mediastinal contours are within normal limits. Both lungs are clear. The visualized skeletal structures are unremarkable. IMPRESSION: No active cardiopulmonary disease. Electronically Signed   By: 08/30/2018 M.D.   On: 11/14/2019 00:39    Procedures Procedures  Medications Ordered in ED Medications - No data to display  ED Course  I have reviewed the triage vital signs and the nursing notes.  Pertinent labs & imaging results that were available during my care of the patient were reviewed by me and considered in my medical decision making (see chart for details).    MDM Rules/Calculators/A&P HEAR Score: 3                         4:55 AM Patient with history of obesity but otherwise healthy presents with sudden onset of chest pain.  She reports the pain episodes are less than 5 minutes.  She reported feeling lightheaded.  When  she had a blood draw she became very lightheaded and had to lie down. Orthostatic vital signs here reveal a blood pressure in the 60s upon standing. Patient be given IV fluids. Initial cardiac evaluation is unremarkable.  Troponin is negative. 5:05 AM Patient refusing further work-up.  She reports she needs to get home for family obligations.  I discussed with her at length that she has  orthostatic hypotension I am concerned she may have a syncopal episode.  Patient reports she has a history of low blood pressure and this is very common for her.  She reports she had no symptoms upon standing and felt well.  Will discharge home at  patient request, but we discussed return precautions.  She was given referral to primary care.  I have low suspicion for ACS/PE/dissection at this time.  Patient in no acute distress at this time, well-appearing   This patient presents to the ED for concern of chest pain, this involves an extensive number of treatment options, and is a complaint that carries with it a high risk of complications and morbidity.  The differential diagnosis includes ACS/PE/dissection, pericarditis, pneumonia   Lab Tests:   I Ordered, reviewed, and interpreted labs, which included troponin, electrolytes, complete blood count  Medicines ordered:   I ordered medication IV fluids for hypotension  Imaging Studies ordered:   I ordered imaging studies which included chest x-ray   I independently visualized and interpreted imaging which showed no acute findings  Additional history obtained:    Previous records obtained and reviewed    Final Clinical Impression(s) / ED Diagnoses Final diagnoses:  Precordial pain  Orthostatic hypotension    Rx / DC Orders ED Discharge Orders    None       Zadie Rhine, MD 11/14/19 0507

## 2019-11-14 NOTE — ED Notes (Signed)
ED Provider at bedside. 

## 2019-11-14 NOTE — Discharge Instructions (Addendum)

## 2019-11-14 NOTE — ED Notes (Signed)
Went in to start IV  Pt refused stating she had been here since 2330 and she did not want to stay any longer as her mother had her baby and her mother had to go to work soon  EDP notified  In to speak with pt

## 2020-02-14 IMAGING — US US MFM OB FOLLOW-UP
1 series · 13 of 28 positions shown · non-contrast
Comparison: none

[Series 1: us mfm ob follow-up · 58 acquisitions, 13 frames shown]
[im 3/58]
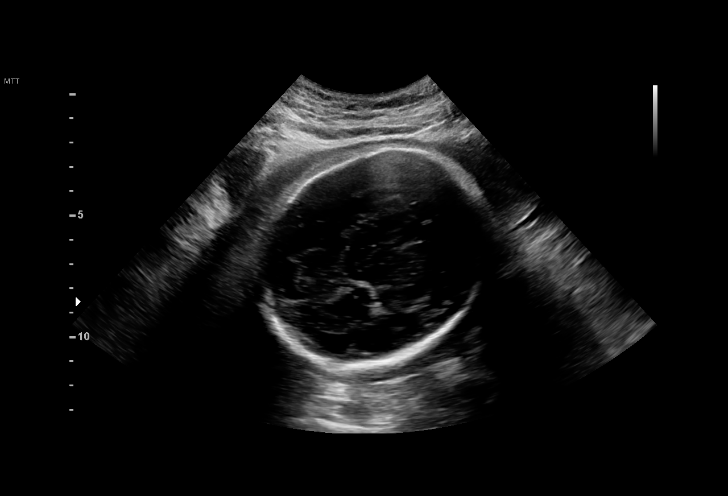
[im 7/58]
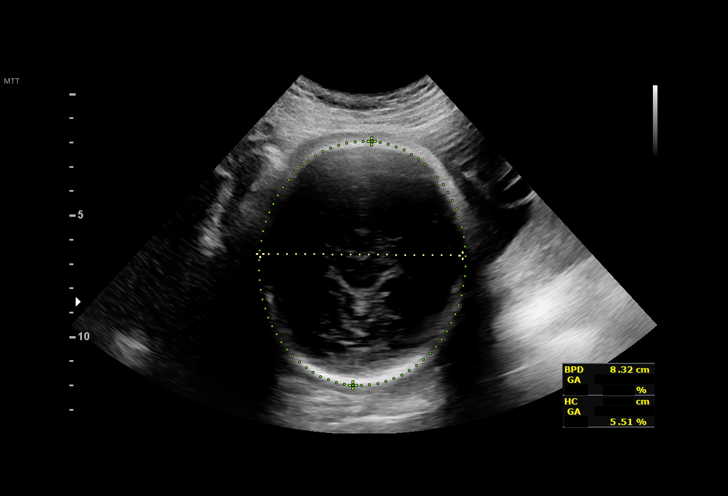
[im 11/58]
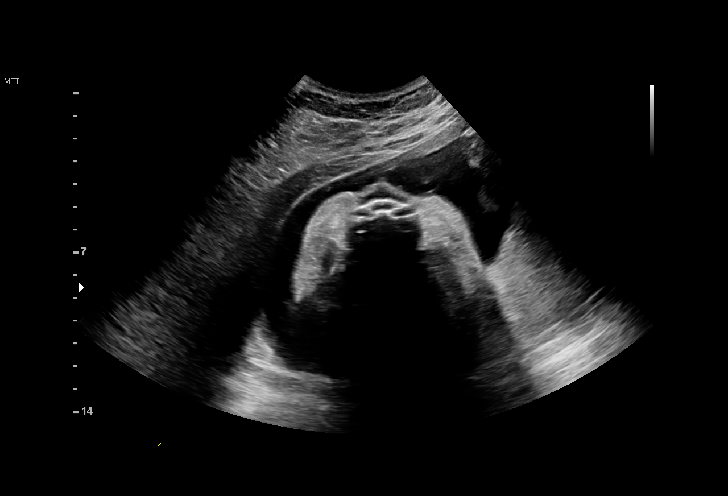
[im 15/58]
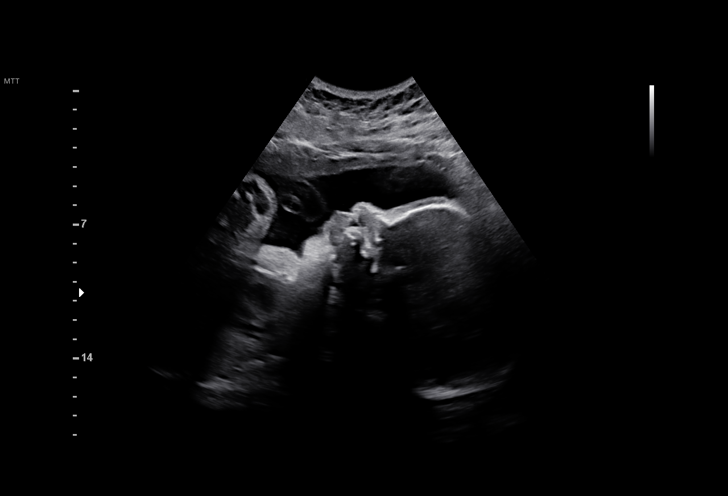
[im 20/58]
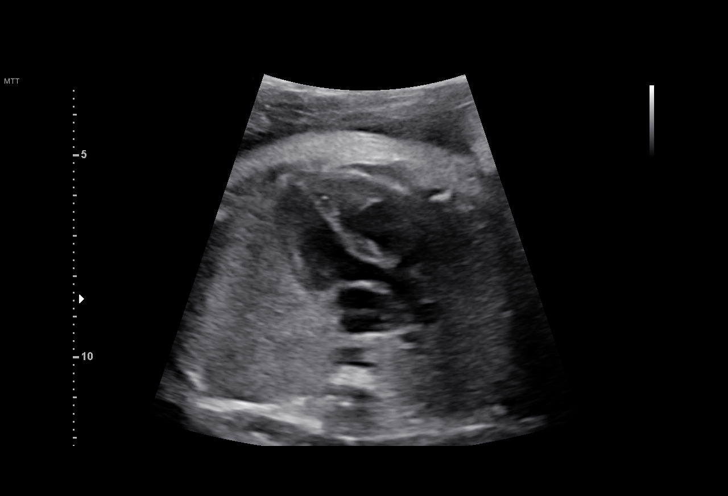
[im 24/58]
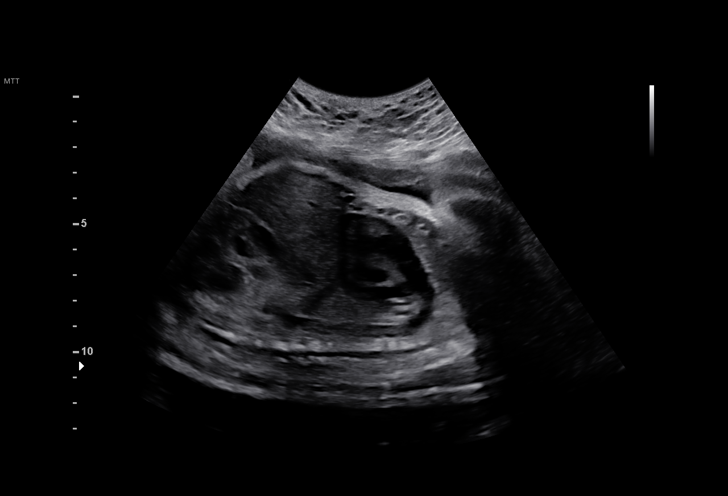
[im 30/58]
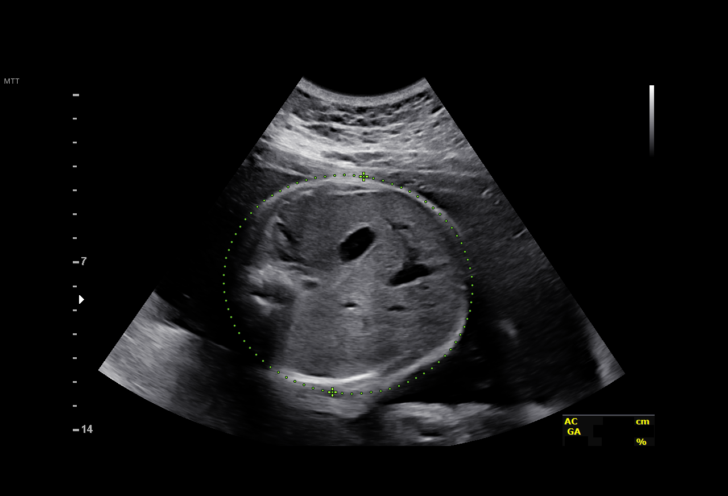
[im 34/58]
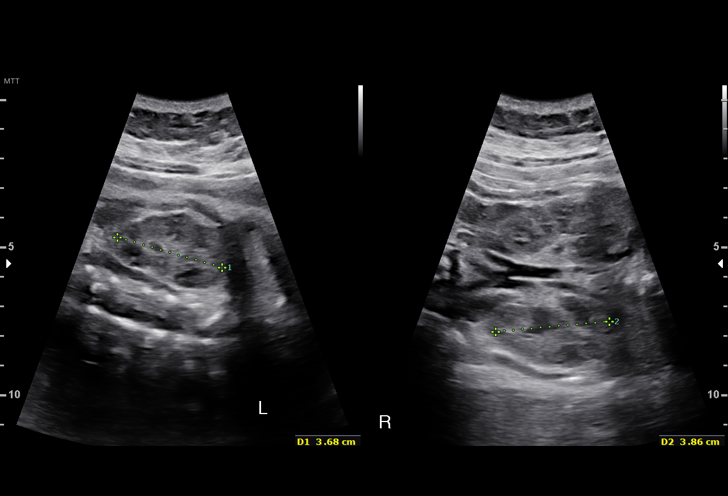
[im 39/58]
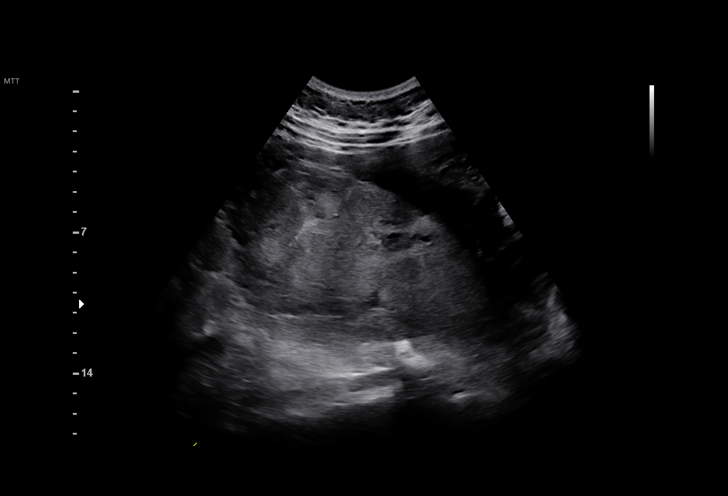
[im 43/58]
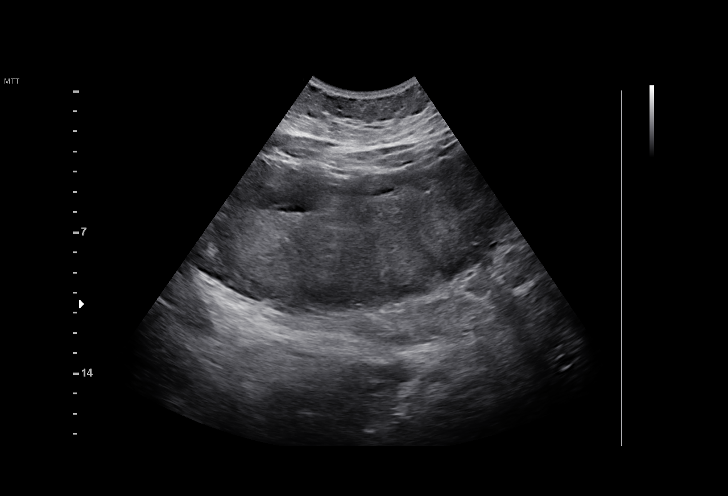
[im 47/58]
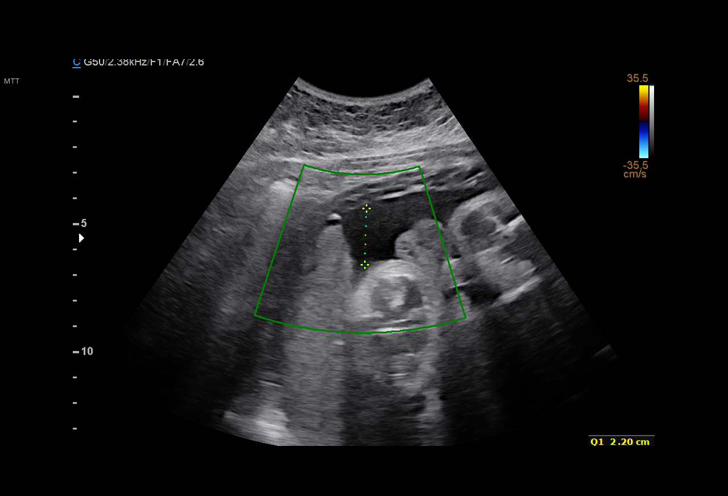
[im 51/58]
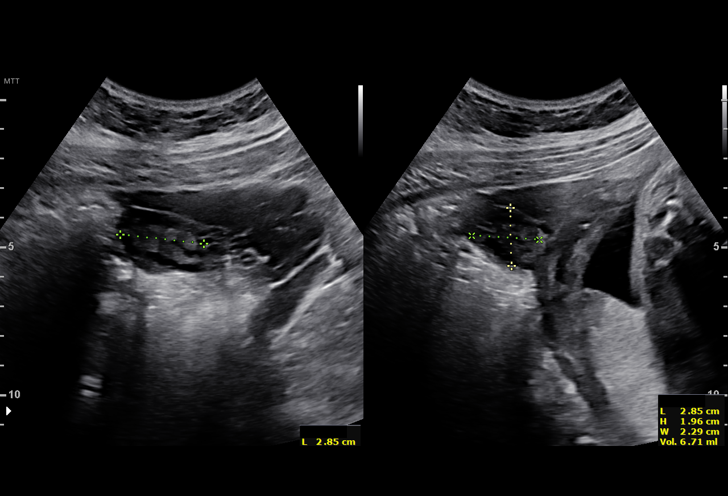
[im 55/58]
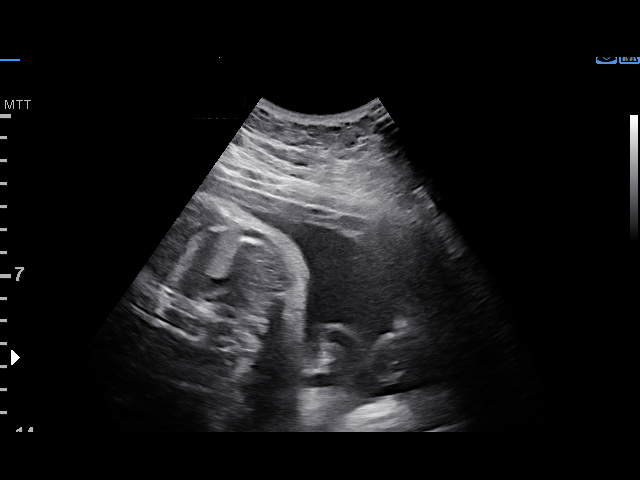

[13 of 28 positions shown; findings below may reference images not displayed]

NXUMALO

 ----------------------------------------------------------------------

 ----------------------------------------------------------------------
Indications

  Gestational diabetes in pregnancy, diet
  controlled
  33 weeks gestation of pregnancy
  Anemia during pregnancy in third trimester
  Genetic carrier (Silent Carrier for Alpha
  Thalassemia)(Negative AFP)( Low Risk
  NIPS)
  Obesity complicating pregnancy, third
  trimester (BMI 43)
  Encounter for other antenatal screening
  follow-up
 ----------------------------------------------------------------------
Vital Signs

                                                Height:        5'5"
Fetal Evaluation

 Num Of Fetuses:         1
 Fetal Heart Rate(bpm):  158
 Cardiac Activity:       Observed
 Presentation:           Cephalic
 Placenta:               Posterior
 P. Cord Insertion:      Previously Visualized

 Amniotic Fluid
 AFI FV:      Within normal limits

 AFI Sum(cm)     %Tile       Largest Pocket(cm)
 13.41           43

 RUQ(cm)       RLQ(cm)       LUQ(cm)        LLQ(cm)

Biometry

 BPD:      82.4  mm     G. Age:  33w 1d         48  %    CI:        80.44   %    70 - 86
                                                         FL/HC:      22.2   %    19.9 -
 HC:      290.2  mm     G. Age:  32w 0d          3  %    HC/AC:      0.94        0.96 -
 AC:      309.5  mm     G. Age:  34w 6d         93  %    FL/BPD:     78.0   %    71 - 87
 FL:       64.3  mm     G. Age:  33w 1d         44  %    FL/AC:      20.8   %    20 - 24

 LV:        6.1  mm

 Est. FW:    2922  gm      5 lb 2 oz     71  %
OB History

 Gravidity:    1
Gestational Age

 LMP:           33w 0d        Date:  09/16/18                 EDD:   06/23/19
 U/S Today:     33w 2d                                        EDD:   06/21/19
 Best:          33w 0d     Det. By:  LMP  (09/16/18)          EDD:   06/23/19
Anatomy

 Cranium:               Previously seen        LVOT:                   Previously seen
 Cavum:                 Previously seen        Aortic Arch:            Previously seen
 Ventricles:            Appears normal         Ductal Arch:            Appears normal
 Choroid Plexus:        Previously seen        Diaphragm:              Appears normal
 Cerebellum:            Previously seen        Stomach:                Appears normal, left
                                                                       sided
 Posterior Fossa:       Previously seen        Abdomen:                Previously seen
 Nuchal Fold:           Previously seen        Abdominal Wall:         Previously seen
 Face:                  Profile appears        Cord Vessels:           Previously seen
                        normal
 Lips:                  Previously seen        Kidneys:                Appear normal
 Palate:                Not well visualized    Bladder:                Appears normal
 Thoracic:              Previously seen        Spine:                  Previously seen
 Heart:                 Appears normal         Upper Extremities:      Previously seen
                        (4CH, axis, and
                        situs)
 RVOT:                  Previously seen        Lower Extremities:      Previously seen

 Other:  Male gender previously seen. Heels previously visualized. Technically
         difficult due to maternal habitus and fetal position.
Cervix Uterus Adnexa

 Cervix
 Not visualized (advanced GA >37wks)

 Uterus
 No abnormality visualized.

 Left Ovary
 Within normal limits. No adnexal mass visualized.

 Right Ovary
 Within normal limits. No adnexal mass visualized.
 Cul De Sac
 No free fluid seen.

 Adnexa
 No abnormality visualized.
Comments

 This patient was seen for a follow up growth scan due to diet-
 controlled gestational diabetes.  The patient reports that her
 fingerstick values have mostly been within normal limits.
 She was informed that the fetal growth and amniotic fluid
 level appears appropriate for her gestational age.
 A follow up exam was scheduled in 4 weeks.

## 2020-03-07 ENCOUNTER — Emergency Department (HOSPITAL_BASED_OUTPATIENT_CLINIC_OR_DEPARTMENT_OTHER)
Admission: EM | Admit: 2020-03-07 | Discharge: 2020-03-08 | Disposition: A | Discharge status: Home / Self Care | Payer: BC Managed Care – PPO | Attending: Emergency Medicine | Admitting: Emergency Medicine

## 2020-03-07 ENCOUNTER — Emergency Department (HOSPITAL_BASED_OUTPATIENT_CLINIC_OR_DEPARTMENT_OTHER): Payer: BC Managed Care – PPO

## 2020-03-07 ENCOUNTER — Other Ambulatory Visit: Payer: Self-pay

## 2020-03-07 ENCOUNTER — Encounter (HOSPITAL_BASED_OUTPATIENT_CLINIC_OR_DEPARTMENT_OTHER): Payer: Self-pay | Admitting: *Deleted

## 2020-03-07 DIAGNOSIS — R109 Unspecified abdominal pain: Secondary | ICD-10-CM | POA: Diagnosis present

## 2020-03-07 DIAGNOSIS — Z20822 Contact with and (suspected) exposure to covid-19: Secondary | ICD-10-CM | POA: Diagnosis not present

## 2020-03-07 DIAGNOSIS — J189 Pneumonia, unspecified organism: Secondary | ICD-10-CM

## 2020-03-07 DIAGNOSIS — R1013 Epigastric pain: Secondary | ICD-10-CM | POA: Insufficient documentation

## 2020-03-07 DIAGNOSIS — R091 Pleurisy: Secondary | ICD-10-CM

## 2020-03-07 DIAGNOSIS — R1012 Left upper quadrant pain: Secondary | ICD-10-CM | POA: Insufficient documentation

## 2020-03-07 LAB — D-DIMER, QUANTITATIVE: D-Dimer, Quant: 1.04 ug/mL-FEU — ABNORMAL HIGH (ref 0.00–0.50)

## 2020-03-07 LAB — CBC WITH DIFFERENTIAL/PLATELET
Abs Immature Granulocytes: 0.03 10*3/uL (ref 0.00–0.07)
Basophils Absolute: 0.1 10*3/uL (ref 0.0–0.1)
Basophils Relative: 1 %
Eosinophils Absolute: 0.4 10*3/uL (ref 0.0–0.5)
Eosinophils Relative: 6 %
HCT: 35.9 % — ABNORMAL LOW (ref 36.0–46.0)
Hemoglobin: 10.4 g/dL — ABNORMAL LOW (ref 12.0–15.0)
Immature Granulocytes: 0 %
Lymphocytes Relative: 41 %
Lymphs Abs: 2.7 10*3/uL (ref 0.7–4.0)
MCH: 19.6 pg — ABNORMAL LOW (ref 26.0–34.0)
MCHC: 29 g/dL — ABNORMAL LOW (ref 30.0–36.0)
MCV: 67.7 fL — ABNORMAL LOW (ref 80.0–100.0)
Monocytes Absolute: 0.7 10*3/uL (ref 0.1–1.0)
Monocytes Relative: 10 %
Neutro Abs: 2.9 10*3/uL (ref 1.7–7.7)
Neutrophils Relative %: 42 %
Platelets: 251 10*3/uL (ref 150–400)
RBC: 5.3 MIL/uL — ABNORMAL HIGH (ref 3.87–5.11)
RDW: 19.5 % — ABNORMAL HIGH (ref 11.5–15.5)
Smear Review: NORMAL
WBC: 6.7 10*3/uL (ref 4.0–10.5)
nRBC: 0 % (ref 0.0–0.2)

## 2020-03-07 LAB — COMPREHENSIVE METABOLIC PANEL
ALT: 19 U/L (ref 0–44)
AST: 35 U/L (ref 15–41)
Albumin: 3.9 g/dL (ref 3.5–5.0)
Alkaline Phosphatase: 73 U/L (ref 38–126)
Anion gap: 9 (ref 5–15)
BUN: 9 mg/dL (ref 6–20)
CO2: 23 mmol/L (ref 22–32)
Calcium: 9 mg/dL (ref 8.9–10.3)
Chloride: 106 mmol/L (ref 98–111)
Creatinine, Ser: 0.76 mg/dL (ref 0.44–1.00)
GFR, Estimated: >60 mL/min (ref >60)
Glucose, Bld: 90 mg/dL (ref 70–99)
Potassium: 4.5 mmol/L (ref 3.5–5.1)
Sodium: 138 mmol/L (ref 135–145)
Total Bilirubin: 0.6 mg/dL (ref 0.3–1.2)
Total Protein: 7.8 g/dL (ref 6.5–8.1)

## 2020-03-07 LAB — PREGNANCY, URINE: Preg Test, Ur: NEGATIVE

## 2020-03-07 LAB — URINALYSIS, ROUTINE W REFLEX MICROSCOPIC
Bilirubin Urine: NEGATIVE
Glucose, UA: NEGATIVE mg/dL
Hgb urine dipstick: NEGATIVE
Ketones, ur: NEGATIVE mg/dL
Nitrite: NEGATIVE
Protein, ur: NEGATIVE mg/dL
Specific Gravity, Urine: <1.005 — ABNORMAL LOW (ref 1.005–1.030)
pH: 6 (ref 5.0–8.0)

## 2020-03-07 LAB — URINALYSIS, MICROSCOPIC (REFLEX)

## 2020-03-07 LAB — LIPASE, BLOOD: Lipase: 28 U/L (ref 11–51)

## 2020-03-07 MED ORDER — IOHEXOL 350 MG/ML SOLN
100.0000 mL | Freq: Once | INTRAVENOUS | Status: AC | PRN
  Administered 2020-03-08: 100 mL via INTRAVENOUS

## 2020-03-07 MED ORDER — SODIUM CHLORIDE 0.9 % IV BOLUS
1000.0000 mL | Freq: Once | INTRAVENOUS | Status: AC
  Administered 2020-03-07: 1000 mL via INTRAVENOUS

## 2020-03-07 MED ORDER — KETOROLAC TROMETHAMINE 30 MG/ML IJ SOLN
30.0000 mg | Freq: Once | INTRAMUSCULAR | Status: AC
  Administered 2020-03-07: 30 mg via INTRAVENOUS
  Filled 2020-03-07: qty 1

## 2020-03-07 NOTE — ED Provider Notes (Signed)
[facility] EMERGENCY DEPARTMENT Provider Note   CSN: [id number] Arrival date & time: 03/07/20  2127     History Chief Complaint  Patient presents with  . Abdominal Pain    Tracey Dunn is a 24 y.o. female history of anemia, here presenting with abdominal pain.  Patient states that She has been having acute onset of left upper quadrant pain that is worse with deep breathing just prior to arrival.  Denies eating any bad food or vomiting.  Patient denies any radiation to the pain.  Denies any urinary symptoms.  Denies any vaginal discharge.  Denies any previous abdominal surgeries.  Denies any shortness of breath or chest pain.  The history is provided by the patient.       Past Medical History:  Diagnosis Date  . Anemia     Patient Active Problem List   Diagnosis Date Noted  . Alpha thalassemia silent carrier 02/07/2019  . 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 Obesity affecting pregnancy, antepartum 01/06/2019  . Overweight 09/23/2017  . OSA (obstructive sleep apnea) 09/10/2016  . Snoring 09/10/2016  . Anemia 04/01/2015  . Menorrhagia 04/01/2015  . Obesity due to excess calories 04/01/2015    Past Surgical History:  Procedure Laterality Date  . TONSILLECTOMY    . WISDOM TOOTH EXTRACTION       OB History    Gravida  1   Para  1   Term  1   Preterm  0   AB  0   Living  1     SAB  0   TAB  0   Ectopic  0   Multiple  0   Live Births  1           Family History  Problem Relation Age of Onset  . Hypertension Mother   . Diabetes Mother        Type 1  . Hypertension Maternal Grandmother   . Cancer Neg Hx   . Stroke Neg Hx     Social History   Tobacco Use  . Smoking status: Never Smoker  . Smokeless tobacco: Never Used  Vaping Use  . Vaping Use: Never used  Substance Use Topics  . Alcohol use: Yes    Comment: occ  . Drug use: No    Home Medications Prior to Admission medications   Medication Sig Start Date End  Date Taking? Authorizing Provider  AMBULATORY NON FORMULARY MEDICATION 1 Device by Other route once a week. Blood pressure cuff/Large  Monitored Regularly at home ICD 10: Z34.90 LROB Patient not taking: Reported on 07/06/2019 01/06/19   [name], MD  budesonide (RHINOCORT AQUA) 32 MCG/ACT nasal spray Place into the nose. 09/10/16   [provider]  ibuprofen (ADVIL) 600 MG tablet Take 1 tablet (600 mg total) by mouth every 6 (six) hours. 06/05/19   Fair, Hoyle Sauer, MD  Norethindrone Acetate-Ethinyl Estrad-FE (LOESTRIN 24 FE) 1-20 MG-MCG(24) tablet Take 1 tablet by mouth daily. 07/06/19   Levie Heritage, DO  Prenatal Vit-Fe Fumarate-FA (PRENATAL VITAMINS PO) Take by mouth.    [provider]  cetirizine (ZYRTEC) 10 MG tablet Take 1 tablet (10 mg total) by mouth daily. Patient not taking: Reported on 07/06/2019 02/13/19 11/14/19  Levie Heritage, DO  ferrous sulfate 325 (65 FE) MG tablet Take 1 tablet (325 mg total) by mouth every other day. Patient not taking: Reported on 07/06/2019 06/06/19 11/14/19  Joselyn Arrow, MD  Allergies    Sulfur  Review of Systems   Review of Systems  Gastrointestinal: Positive for abdominal pain.  All other systems reviewed and are negative.   Physical Exam Updated Vital Signs BP 113/65   Pulse 86   Temp 99.3 F (37.4 C) (Oral)   Resp 16   Ht 5\' 5"  (1.651 m)   Wt 111.1 kg   LMP 02/16/2020   SpO2 100%   BMI 40.77 kg/m   Physical Exam Vitals and nursing note reviewed.  Constitutional:      Appearance: She is well-developed.  HENT:     Head: Normocephalic.     Mouth/Throat:     Pharynx: Oropharynx is clear.  Eyes:     Extraocular Movements: Extraocular movements intact.     Pupils: Pupils are equal, round, and reactive to light.  Cardiovascular:     Rate and Rhythm: Normal rate and regular rhythm.     Heart sounds: Normal heart sounds.  Pulmonary:     Effort: Pulmonary effort is normal.     Breath sounds:  Normal breath sounds.  Abdominal:     Comments: Mild epigastric and left upper quadrant tenderness.  No right-sided tenderness.  Skin:    General: Skin is warm.     Capillary Refill: Capillary refill takes less than 2 seconds.  Neurological:     General: No focal deficit present.     Mental Status: She is alert and oriented to person, place, and time.  Psychiatric:        Mood and Affect: Mood normal.        Behavior: Behavior normal.     ED Results / Procedures / Treatments   Labs (all labs ordered are listed, but only abnormal results are displayed) Labs Reviewed  URINALYSIS, ROUTINE W REFLEX MICROSCOPIC - Abnormal; Notable for the following components:      Result Value   APPearance HAZY (*)    Specific Gravity, Urine <1.005 (*)    Leukocytes,Ua TRACE (*)    All other components within normal limits  CBC WITH DIFFERENTIAL/PLATELET - Abnormal; Notable for the following components:   RBC 5.30 (*)    Hemoglobin 10.4 (*)    HCT 35.9 (*)    MCV 67.7 (*)    MCH 19.6 (*)    MCHC 29.0 (*)    RDW 19.5 (*)    All other components within normal limits  D-DIMER, QUANTITATIVE (NOT AT [facility]) - Abnormal; Notable for the following components:   D-Dimer, Quant 1.04 (*)    All other components within normal limits  URINALYSIS, MICROSCOPIC (REFLEX) - Abnormal; Notable for the following components:   Bacteria, UA FEW (*)    All other components within normal limits  PREGNANCY, URINE  COMPREHENSIVE METABOLIC PANEL  LIPASE, BLOOD    EKG None  Radiology No results found.  Procedures Procedures (including critical care time)  Medications Ordered in ED Medications  iohexol (OMNIPAQUE) 350 MG/ML injection 100 mL (has no administration in time range)  sodium chloride 0.9 % bolus 1,000 mL (1,000 mLs Intravenous New Bag/Given 03/07/20 2209)  ketorolac (TORADOL) 30 MG/ML injection 30 mg (30 mg Intravenous Given 03/07/20 2257)    ED Course  I have reviewed the triage vital signs and  the nursing notes.  Pertinent labs & imaging results that were available during my care of the patient were reviewed by me and considered in my medical decision making (see chart for details).    MDM Rules/Calculators/A&P  Tracey Dunn is a 24 y.o. female here presenting with left upper quadrant pain.  Consider gastritis versus PE (patient tachycardic and it is worse with deep breathing), less likely renal colic or Pyelo.  Plan to get CBC, CMP, lipase, D-dimer, UA, CT abdomen pelvis.  11:28 PM D-dimer positive. Ordered CTA chest as well in addition to CT ab/pel. Labs and UA unremarkable. If CT negative, anticipate dc home. Signed out to Dr. Bebe Shaggy in the ED.    Final Clinical Impression(s) / ED Diagnoses Final diagnoses:  None    Rx / DC Orders ED Discharge Orders    None       Charlynne Pander, MD 03/07/20 2328

## 2020-03-07 NOTE — ED Triage Notes (Signed)
C/o left upper abd pain and nausea x 1 hr

## 2020-03-08 LAB — RESP PANEL BY RT-PCR (FLU A&B, COVID) ARPGX2
Influenza A by PCR: NEGATIVE
Influenza B by PCR: NEGATIVE
SARS Coronavirus 2 by RT PCR: NEGATIVE

## 2020-03-08 MED ORDER — AZITHROMYCIN 250 MG PO TABS: ORAL_TABLET | ORAL | 0 refills | Status: DC

## 2020-03-08 NOTE — ED Provider Notes (Signed)
CT scan negative for PE, but does show signs of atypical infection which could represent COVID-19.  Patient is unvaccinated. Plan to test for COVID-19 here and if negative will treat for pneumonia   Zadie Rhine, MD 03/08/20 [phone]

## 2020-03-08 NOTE — ED Provider Notes (Signed)
Negative Covid test.  Patient otherwise well-appearing. Due to atypical infection on CT scan, will start on Z-Pak. She is otherwise well-appearing.  No hypoxia no acute distress. Advised her that if she is on birth control, that antibiotics can decrease the effectiveness.   Tracey Dunn was evaluated in Emergency Department on 03/08/2020 for the symptoms described in the history of present illness. She was evaluated in the context of the global COVID-19 pandemic, which necessitated consideration that the patient might be at risk for infection with the SARS-CoV-2 virus that causes COVID-19. Institutional protocols and algorithms that pertain to the evaluation of patients at risk for COVID-19 are in a state of rapid change based on information released by regulatory bodies including the CDC and federal and state organizations. These policies and algorithms were followed during the patient's care in the ED.    Zadie Rhine, MD 03/08/20 [phone]

## 2020-12-12 ENCOUNTER — Other Ambulatory Visit: Payer: Self-pay

## 2020-12-12 ENCOUNTER — Emergency Department (HOSPITAL_BASED_OUTPATIENT_CLINIC_OR_DEPARTMENT_OTHER)
Admission: EM | Admit: 2020-12-12 | Discharge: 2020-12-12 | Disposition: A | Discharge status: Home / Self Care | Payer: Medicaid Other | Attending: Emergency Medicine | Admitting: Emergency Medicine

## 2020-12-12 DIAGNOSIS — J069 Acute upper respiratory infection, unspecified: Secondary | ICD-10-CM | POA: Insufficient documentation

## 2020-12-12 DIAGNOSIS — Z8616 Personal history of COVID-19: Secondary | ICD-10-CM | POA: Diagnosis not present

## 2020-12-12 DIAGNOSIS — R059 Cough, unspecified: Secondary | ICD-10-CM | POA: Diagnosis present

## 2020-12-12 DIAGNOSIS — H9203 Otalgia, bilateral: Secondary | ICD-10-CM | POA: Diagnosis not present

## 2020-12-12 MED ORDER — DEXAMETHASONE 6 MG PO TABS
10.0000 mg | ORAL_TABLET | Freq: Once | ORAL | Status: AC
  Administered 2020-12-12: 10 mg via ORAL
  Filled 2020-12-12: qty 1

## 2020-12-12 NOTE — Discharge Instructions (Signed)
Decadron is given today in the ER.  This should help with your symptoms over the course the next 24 hours. Recommend taking Zyrtec every day and using Flonase daily. Recheck with your primary care provider if symptoms continue.

## 2020-12-12 NOTE — ED Triage Notes (Signed)
Patient c/o bilateral swelling of "lymph nodes" at base of neck, also bilateral ear pain, both started on Tuesday.

## 2020-12-12 NOTE — ED Provider Notes (Signed)
[facility] EMERGENCY DEPARTMENT Provider Note   CSN: [id number] Arrival date & time: 12/12/20  1020     History Chief Complaint  Patient presents with   Otalgia    Tracey Dunn is a 25 y.o. female.  25 year old female presents with complaint of bilateral ear pain, sore throat, swelling of the neck x2 days, ongoing cough and congestion from COVID 1 month ago.  Denies fevers, chills.  Cough is nonproductive.  No other sick contacts.  No other complaints or concerns.      Past Medical History:  Diagnosis Date   Anemia     Patient Active Problem List   Diagnosis Date Noted   Alpha thalassemia silent carrier 02/07/2019   LGSIL on Pap smear of cervix 01/27/2019   Obesity affecting pregnancy, antepartum 01/06/2019   Overweight 09/23/2017   OSA (obstructive sleep apnea) 09/10/2016   Snoring 09/10/2016   Anemia 04/01/2015   Menorrhagia 04/01/2015   Obesity due to excess calories 04/01/2015    Past Surgical History:  Procedure Laterality Date   TONSILLECTOMY     WISDOM TOOTH EXTRACTION       OB History     Gravida  1   Para  1   Term  1   Preterm  0   AB  0   Living  1      SAB  0   IAB  0   Ectopic  0   Multiple  0   Live Births  1           Family History  Problem Relation Age of Onset   Hypertension Mother    Diabetes Mother        Type 1   Hypertension Maternal Grandmother    Cancer Neg Hx    Stroke Neg Hx     Social History   Tobacco Use   Smoking status: Never   Smokeless tobacco: Never  Vaping Use   Vaping Use: Never used  Substance Use Topics   Alcohol use: Yes    Comment: occ   Drug use: No    Home Medications Prior to Admission medications   Medication Sig Start Date End Date Taking? Authorizing Provider  AMBULATORY NON FORMULARY MEDICATION 1 Device by Other route once a week. Blood pressure cuff/Large  Monitored Regularly at home ICD 10: Z34.90 LROB Patient not taking: Reported on 07/06/2019 01/06/19    [name], MD  budesonide (RHINOCORT AQUA) 32 MCG/ACT nasal spray Place into the nose. 09/10/16   [provider]  ibuprofen (ADVIL) 600 MG tablet Take 1 tablet (600 mg total) by mouth every 6 (six) hours. 06/05/19   Fair, Hoyle Sauer, MD  Norethindrone Acetate-Ethinyl Estrad-FE (LOESTRIN 24 FE) 1-20 MG-MCG(24) tablet Take 1 tablet by mouth daily. 07/06/19   Levie Heritage, DO  Prenatal Vit-Fe Fumarate-FA (PRENATAL VITAMINS PO) Take by mouth.    [provider]  cetirizine (ZYRTEC) 10 MG tablet Take 1 tablet (10 mg total) by mouth daily. Patient not taking: Reported on 07/06/2019 02/13/19 11/14/19  Levie Heritage, DO  ferrous sulfate 325 (65 FE) MG tablet Take 1 tablet (325 mg total) by mouth every other day. Patient not taking: Reported on 07/06/2019 06/06/19 11/14/19  Joselyn Arrow, MD    Allergies    Elemental sulfur  Review of Systems   Review of Systems  Constitutional:  Negative for chills, diaphoresis and fever.  HENT:  Positive for congestion, ear pain and sore throat. Negative for ear  discharge, sinus pressure, sinus pain, trouble swallowing and voice change.   Respiratory:  Positive for cough.   Musculoskeletal:  Negative for arthralgias and myalgias.  Skin:  Negative for rash and wound.  Allergic/Immunologic: Negative for immunocompromised state.  Neurological:  Negative for weakness and headaches.  Hematological:  Positive for adenopathy.  All other systems reviewed and are negative.  Physical Exam Updated Vital Signs BP 136/86 (BP Location: Right Arm)   Pulse 98   Temp 98.8 F (37.1 C) (Oral)   Resp 18   Ht 5\' 5"  (1.651 m)   Wt 113.4 kg   SpO2 98%   BMI 41.60 kg/m   Physical Exam Vitals and nursing note reviewed.  Constitutional:      General: She is not in acute distress.    Appearance: She is well-developed. She is not diaphoretic.  HENT:     Head: Normocephalic and atraumatic.     Right Ear: Tympanic membrane and ear canal  normal.     Left Ear: Tympanic membrane and ear canal normal.     Nose: Congestion present.     Mouth/Throat:     Mouth: Mucous membranes are moist.     Pharynx: Posterior oropharyngeal erythema present. No oropharyngeal exudate.  Eyes:     Conjunctiva/sclera: Conjunctivae normal.  Cardiovascular:     Rate and Rhythm: Normal rate and regular rhythm.     Pulses: Normal pulses.     Heart sounds: Normal heart sounds.  Pulmonary:     Effort: Pulmonary effort is normal.     Breath sounds: Normal breath sounds.  Musculoskeletal:     Cervical back: Neck supple.     Right lower leg: No edema.     Left lower leg: No edema.  Lymphadenopathy:     Cervical: No cervical adenopathy.  Skin:    General: Skin is warm and dry.     Findings: No erythema or rash.  Neurological:     Mental Status: She is alert and oriented to person, place, and time.  Psychiatric:        Behavior: Behavior normal.    ED Results / Procedures / Treatments   Labs (all labs ordered are listed, but only abnormal results are displayed) Labs Reviewed - No data to display  EKG None  Radiology No results found.  Procedures Procedures   Medications Ordered in ED Medications  dexamethasone (DECADRON) tablet 10 mg (has no administration in time range)    ED Course  I have reviewed the triage vital signs and the nursing notes.  Pertinent labs & imaging results that were available during my care of the patient were reviewed by me and considered in my medical decision making (see chart for details).  Clinical Course as of 12/12/20 1114  Thu Dec 12, 2020  [id number] 25 year old female with complaint of ear pain and sore throat as above.  As well as cough and congestion since having COVID 1 month ago. On exam, found to have nasal congestion, postnasal drip with cobblestoning appearance of the posterior oropharynx.  No significant lymphadenopathy.  Voice is clear, respirations are even and unlabored.  Vitals reviewed and  reassuring with O2 sat of 98% on room air. Patient was given a dose of Decadron in the ED for her symptoms, suspect her postnasal drip and general inflammation of oropharynx may be causing blocked eustachian tubes causing her ear pain. Recommend she use Zyrtec and Flonase. Recheck with primary care provider if symptoms persist. [LM]    Clinical  Course User Index [LM] Alden Hipp   MDM Rules/Calculators/A&P                           Final Clinical Impression(s) / ED Diagnoses Final diagnoses:  Viral upper respiratory tract infection    Rx / DC Orders ED Discharge Orders     None        Jeannie Fend, PA-C 12/12/20 1114    Terald Sleeper, MD 12/12/20 [phone]

## 2020-12-18 IMAGING — CT CT ANGIO CHEST
3 of 8 series · 15 of 36 positions shown · IV contrast (Omnipaque)
Comparison: None.

CLINICAL DATA: Elevated D-dimer, low probability pulmonary
embolism, left upper quadrant abdominal pain, nausea

EXAM:
CT ANGIOGRAPHY CHEST
CT ABDOMEN AND PELVIS WITH CONTRAST
TECHNIQUE: Multidetector CT imaging of the chest was performed using the
standard protocol during bolus administration of intravenous
contrast. Multiplanar CT image reconstructions and MIPs were
obtained to evaluate the vascular anatomy. Multidetector CT imaging
of the abdomen and pelvis was performed using the standard protocol
during bolus administration of intravenous contrast.
CONTRAST:  100mL OMNIPAQUE IOHEXOL 350 MG/ML SOLN

[Series 4: pe axial st · axial · 0.94mm/px · z∈[+1125,+1338]mm · 6 of 101 slices shown]
[im 15/101  lung]
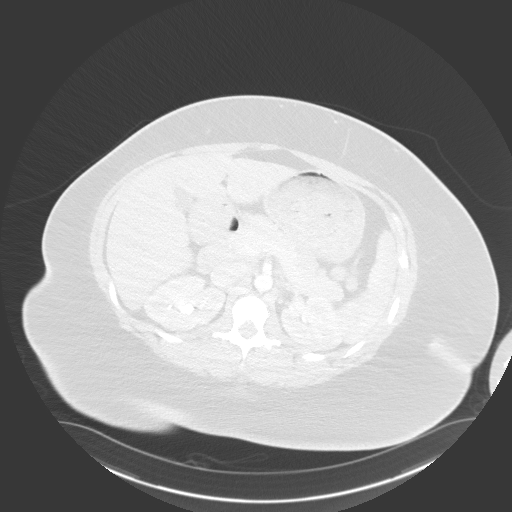
[im 29/101  mediastinal]
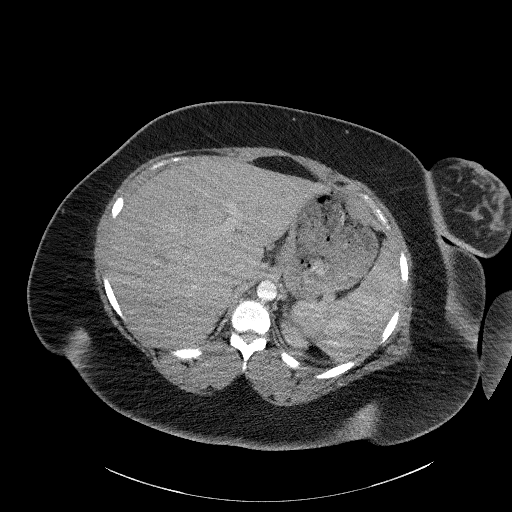
[im 43/101  lung]
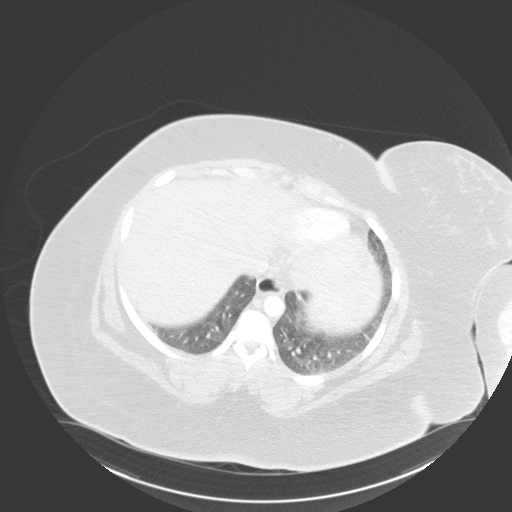
[im 58/101  mediastinal]
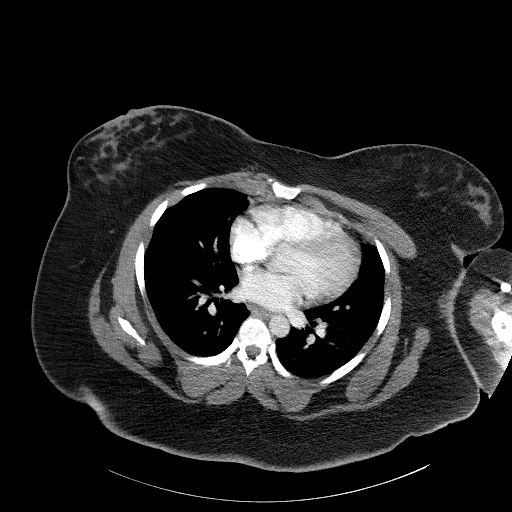
[im 72/101  lung]
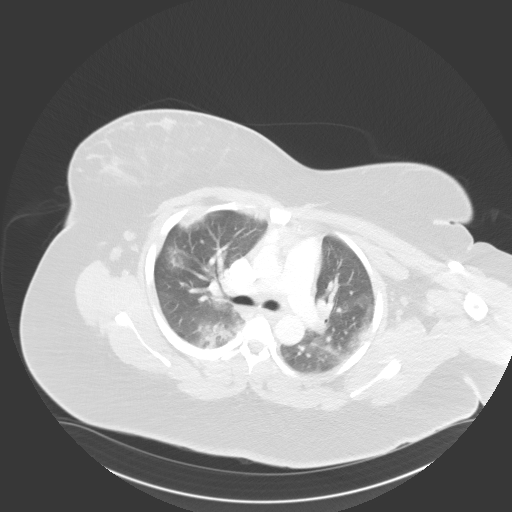
[im 86/101  mediastinal]
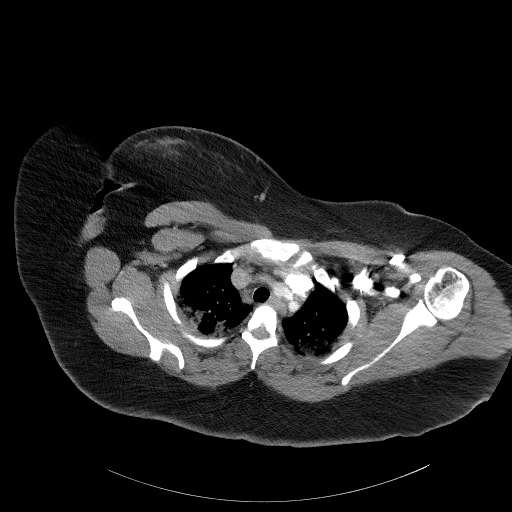

[Series 6: pe coronal mpr · coronal · 0.49mm/px · 1 of 188 slices shown]
[im 94/188  mediastinal]
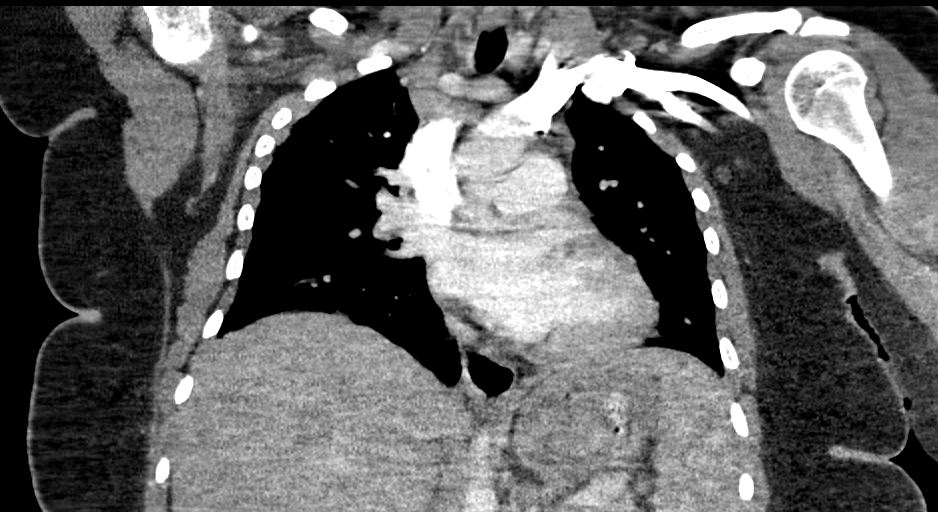

[Series 10: pe thins · axial · 0.94mm/px · z∈[+1194,+1370]mm · 8 of 215 slices shown]
[im 26/215  lung]
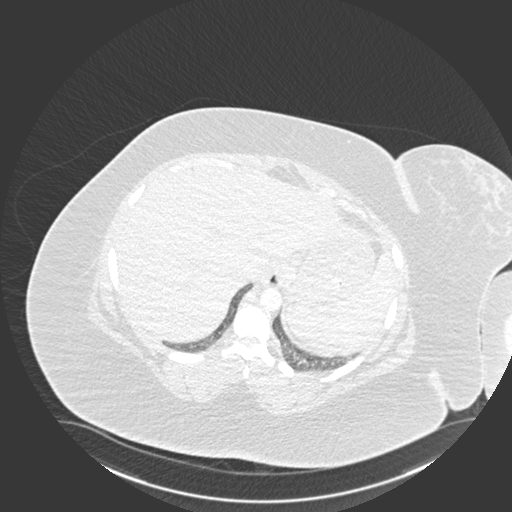
[im 51/215  lung]
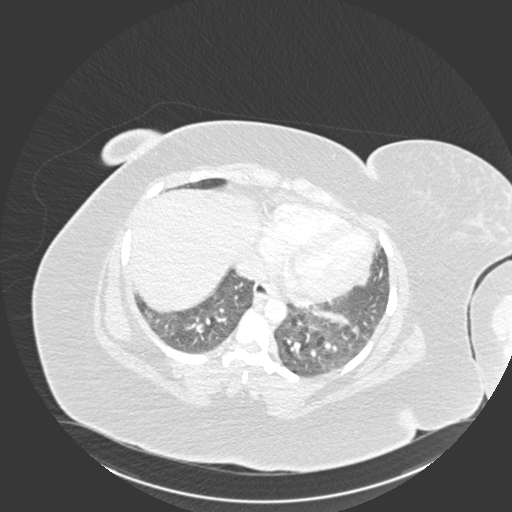
[im 76/215  lung]
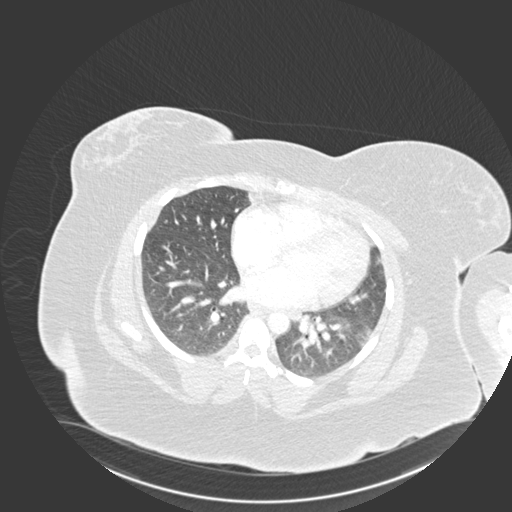
[im 101/215  lung]
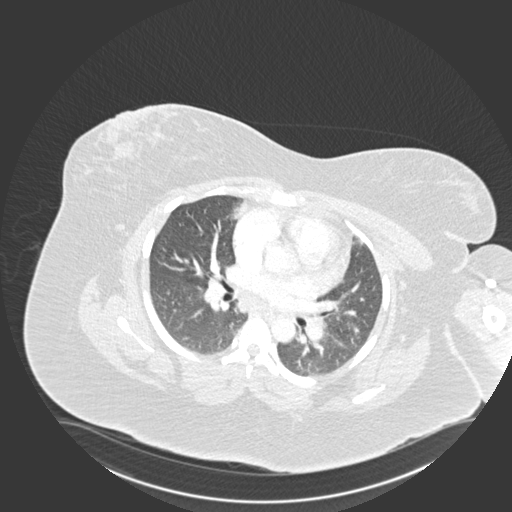
[im 126/215  lung]
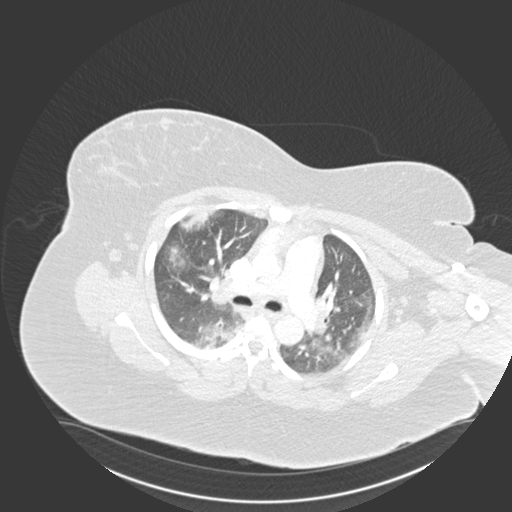
[im 152/215  lung]
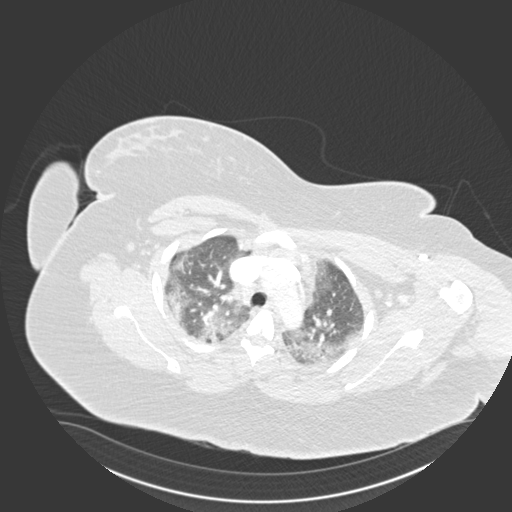
[im 177/215  lung]
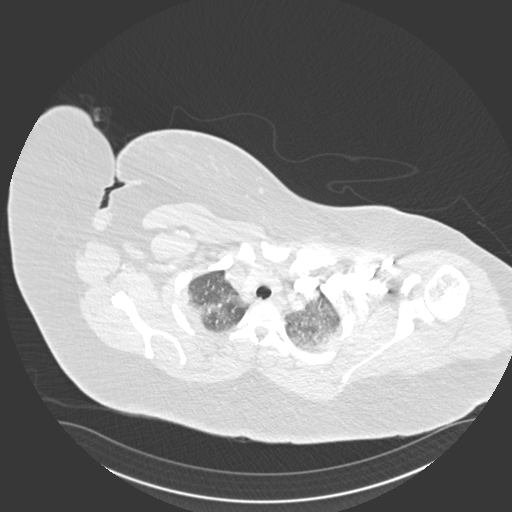
[im 202/215  lung]
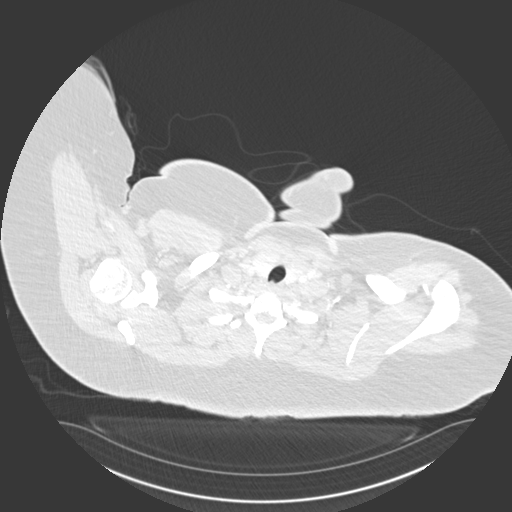

[15 of 36 positions shown; findings below may reference images not displayed]

FINDINGS: CTA CHEST FINDINGS

Cardiovascular: Opacification of the central pulmonary arteries is
slightly limited by motion artifact and suboptimal bolus timing. As
result, the examination is adequate for exclusion of intraluminal
filling defect within the central and lobar pulmonary arteries, of
which there is none. The segmental and subsegmental pulmonary
arteries are not well assessed on this examination. Central
pulmonary arteries are of normal caliber. No significant coronary
artery calcification. Global cardiac size within normal limits. No
pericardial effusion. Thoracic aorta is unremarkable.

Mediastinum/Nodes: There is shotty mediastinal and bilateral hilar
adenopathy, possibly reactive in nature. No frankly pathologic
adenopathy within the thorax. Thyroid unremarkable. Esophagus
unremarkable.

Lungs/Pleura: There are moderate bilateral apically predominant
ground-glass pulmonary infiltrates most in keeping with atypical
infection in the acute setting. No pneumothorax or pleural effusion.
No central obstructing lesion.

Musculoskeletal: No chest wall abnormality. No acute or significant
osseous findings.

Review of the MIP images confirms the above findings.

CT ABDOMEN and PELVIS FINDINGS

Hepatobiliary: No focal liver abnormality is seen. No gallstones,
gallbladder wall thickening, or biliary dilatation.

Pancreas: Unremarkable

Spleen: Unremarkable

Adrenals/Urinary Tract: Adrenal glands are unremarkable. Kidneys are
normal, without renal calculi, focal lesion, or hydronephrosis.
Bladder is unremarkable.

Stomach/Bowel: Stomach is within normal limits. Appendix appears
normal. No evidence of bowel wall thickening, distention, or
inflammatory changes. No free intraperitoneal gas or fluid.

Vascular/Lymphatic: The abdominal vasculature is unremarkable. There
is extensive shotty mesenteric adenopathy, likely reactive or
inflammatory in nature, as can be seen with gastroenteritis or
mesenteric adenitis. There is no frankly pathologic adenopathy seen
within the abdomen and pelvis.

Reproductive: 4.4 cm cyst is seen within the right ovary likely
representing a benign follicular cyst in a patient of this age.
Pelvic organs are otherwise unremarkable.

Other: Rectum unremarkable

Musculoskeletal: No acute bone abnormality.

Review of the MIP images confirms the above findings.
IMPRESSION: Multifocal upper lobe predominant pulmonary infiltrates most in
keeping with atypical infection in the acute setting.

No pulmonary embolism within the central pulmonary arteries.
Segmental and subsegmental pulmonary arteries are not adequately
evaluated on this examination due to limitations listed above.

Shotty mesenteric adenopathy, likely reactive or inflammatory in
nature.

## 2021-01-28 ENCOUNTER — Emergency Department (HOSPITAL_BASED_OUTPATIENT_CLINIC_OR_DEPARTMENT_OTHER)
Admission: EM | Admit: 2021-01-28 | Discharge: 2021-01-28 | Disposition: A | Discharge status: Home / Self Care | Payer: Medicaid Other | Attending: Emergency Medicine | Admitting: Emergency Medicine

## 2021-01-28 ENCOUNTER — Other Ambulatory Visit: Payer: Self-pay

## 2021-01-28 ENCOUNTER — Encounter (HOSPITAL_BASED_OUTPATIENT_CLINIC_OR_DEPARTMENT_OTHER): Payer: Self-pay

## 2021-01-28 DIAGNOSIS — D72829 Elevated white blood cell count, unspecified: Secondary | ICD-10-CM | POA: Insufficient documentation

## 2021-01-28 DIAGNOSIS — M5442 Lumbago with sciatica, left side: Secondary | ICD-10-CM

## 2021-01-28 DIAGNOSIS — A599 Trichomoniasis, unspecified: Secondary | ICD-10-CM | POA: Insufficient documentation

## 2021-01-28 DIAGNOSIS — M545 Low back pain, unspecified: Secondary | ICD-10-CM | POA: Diagnosis not present

## 2021-01-28 DIAGNOSIS — M5441 Lumbago with sciatica, right side: Secondary | ICD-10-CM

## 2021-01-28 DIAGNOSIS — B379 Candidiasis, unspecified: Secondary | ICD-10-CM

## 2021-01-28 LAB — URINALYSIS, ROUTINE W REFLEX MICROSCOPIC
Bilirubin Urine: NEGATIVE
Glucose, UA: NEGATIVE mg/dL
Ketones, ur: NEGATIVE mg/dL
Nitrite: NEGATIVE
Protein, ur: NEGATIVE mg/dL
Specific Gravity, Urine: 1.025 (ref 1.005–1.030)
pH: 6 (ref 5.0–8.0)

## 2021-01-28 LAB — URINALYSIS, MICROSCOPIC (REFLEX): WBC, UA: >50 WBC/hpf (ref 0–5)

## 2021-01-28 LAB — PREGNANCY, URINE: Preg Test, Ur: NEGATIVE

## 2021-01-28 MED ORDER — FLUCONAZOLE 200 MG PO TABS: 200.0000 mg | ORAL_TABLET | Freq: Once | ORAL | 0 refills | Status: AC

## 2021-01-28 MED ORDER — METRONIDAZOLE 500 MG PO TABS: 500.0000 mg | ORAL_TABLET | Freq: Two times a day (BID) | ORAL | 0 refills | Status: DC

## 2021-01-28 MED ORDER — BACLOFEN 10 MG PO TABS: 10.0000 mg | ORAL_TABLET | Freq: Three times a day (TID) | ORAL | 0 refills | Status: DC

## 2021-01-28 MED ORDER — NAPROXEN 500 MG PO TABS: 500.0000 mg | ORAL_TABLET | Freq: Two times a day (BID) | ORAL | 0 refills | Status: DC

## 2021-01-28 NOTE — ED Provider Notes (Addendum)
[facility] EMERGENCY DEPARTMENT Provider Note   CSN: [id number] Arrival date & time: 01/28/21  1222     History Chief Complaint  Patient presents with   Back Pain    Tracey Dunn is a 25 y.o. female.  25 year old female presents today for evaluation of lower back pain of 3 days duration.  Patient reports does not recall any trauma or injury to her back.  She works from home and predominately sits on a chair.  She does have history of sciatica during her pregnancy, but denies issues since then.  She is unable to recall alleviating or aggravating factors.  She has not tried anything over-the-counter for this.  She denies fever, chills, abdominal pain, unintentional weight loss, saddle anesthesia, loss of bowel or bladder control.  She does not have history of IV drug use.  In addition to her back pain she does report dysuria and vaginal irritation during the same duration.  She denies vaginal discharge, vaginal bleeding, or other urinary complaints.  She reports a history of sexual activity couple months ago with protection.  She reports she has not had sexual intercourse since.  The history is provided by the patient. No language interpreter was used.      Past Medical History:  Diagnosis Date   Anemia     Patient Active Problem List   Diagnosis Date Noted   Alpha thalassemia silent carrier 02/07/2019   LGSIL on Pap smear of cervix 01/27/2019   Obesity affecting pregnancy, antepartum 01/06/2019   Overweight 09/23/2017   OSA (obstructive sleep apnea) 09/10/2016   Snoring 09/10/2016   Anemia 04/01/2015   Menorrhagia 04/01/2015   Obesity due to excess calories 04/01/2015    Past Surgical History:  Procedure Laterality Date   TONSILLECTOMY     WISDOM TOOTH EXTRACTION       OB History     Gravida  1   Para  1   Term  1   Preterm  0   AB  0   Living  1      SAB  0   IAB  0   Ectopic  0   Multiple  0   Live Births  1           Family  History  Problem Relation Age of Onset   Hypertension Mother    Diabetes Mother        Type 1   Hypertension Maternal Grandmother    Cancer Neg Hx    Stroke Neg Hx     Social History   Tobacco Use   Smoking status: Never   Smokeless tobacco: Never  Vaping Use   Vaping Use: Never used  Substance Use Topics   Alcohol use: Yes    Comment: occ   Drug use: No    Home Medications Prior to Admission medications   Medication Sig Start Date End Date Taking? Authorizing Provider  AMBULATORY NON FORMULARY MEDICATION 1 Device by Other route once a week. Blood pressure cuff/Large  Monitored Regularly at home ICD 10: Z34.90 LROB Patient not taking: Reported on 07/06/2019 01/06/19   [name], MD  budesonide (RHINOCORT AQUA) 32 MCG/ACT nasal spray Place into the nose. 09/10/16   [provider]  ibuprofen (ADVIL) 600 MG tablet Take 1 tablet (600 mg total) by mouth every 6 (six) hours. 06/05/19   Fair, Hoyle Sauer, MD  Norethindrone Acetate-Ethinyl Estrad-FE (LOESTRIN 24 FE) 1-20 MG-MCG(24) tablet Take 1 tablet by mouth daily. 07/06/19  Levie Heritage, DO  Prenatal Vit-Fe Fumarate-FA (PRENATAL VITAMINS PO) Take by mouth.    [provider]  cetirizine (ZYRTEC) 10 MG tablet Take 1 tablet (10 mg total) by mouth daily. Patient not taking: Reported on 07/06/2019 02/13/19 11/14/19  Levie Heritage, DO  ferrous sulfate 325 (65 FE) MG tablet Take 1 tablet (325 mg total) by mouth every other day. Patient not taking: Reported on 07/06/2019 06/06/19 11/14/19  Joselyn Arrow, MD    Allergies    Elemental sulfur  Review of Systems   Review of Systems  Constitutional:  Negative for activity change, appetite change and fever.  Respiratory:  Negative for shortness of breath.   Gastrointestinal:  Negative for abdominal distention, abdominal pain, nausea and vomiting.  Genitourinary:  Positive for dysuria. Negative for decreased urine volume, difficulty urinating, pelvic pain,  vaginal bleeding and vaginal discharge.  Musculoskeletal:  Positive for back pain. Negative for gait problem.  All other systems reviewed and are negative.  Physical Exam Updated Vital Signs BP 107/76 (BP Location: Left Arm)   Pulse 91   Temp 98.4 F (36.9 C) (Oral)   Resp 18   Ht 5\' 5"  (1.651 m)   Wt 129.7 kg   LMP 12/28/2020 (Exact Date)   SpO2 98%   BMI 47.59 kg/m   Physical Exam Vitals and nursing note reviewed.  Constitutional:      General: She is not in acute distress.    Appearance: Normal appearance. She is not ill-appearing.  HENT:     Head: Normocephalic and atraumatic.     Nose: Nose normal.  Eyes:     General: No scleral icterus.    Extraocular Movements: Extraocular movements intact.     Conjunctiva/sclera: Conjunctivae normal.  Cardiovascular:     Rate and Rhythm: Normal rate and regular rhythm.     Pulses: Normal pulses.     Heart sounds: Normal heart sounds.  Pulmonary:     Effort: Pulmonary effort is normal. No respiratory distress.     Breath sounds: Normal breath sounds. No wheezing or rales.  Abdominal:     General: There is no distension.     Tenderness: There is no abdominal tenderness.  Musculoskeletal:        General: No deformity. Normal range of motion.     Cervical back: Normal range of motion.     Right lower leg: No edema.     Left lower leg: No edema.     Comments: Visually patient's back is without deformity, wound, or rash.  She is without tenderness to palpation over her cervical, thoracic, lumbar spine.  She does have tenderness to palpation over bilateral lumbar paraspinal muscles.  She has full active range of motion.  Bilateral lower extremity strength is 5 out of 5.  Bilateral lower extremity sensation is intact and symmetrical.  DP pulses 2+ and symmetrical.  Straight leg raise test is positive bilaterally.  Skin:    General: Skin is warm and dry.     Findings: No rash.  Neurological:     General: No focal deficit present.      Mental Status: She is alert. Mental status is at baseline.    ED Results / Procedures / Treatments   Labs (all labs ordered are listed, but only abnormal results are displayed) Labs Reviewed  URINALYSIS, ROUTINE W REFLEX MICROSCOPIC - Abnormal; Notable for the following components:      Result Value   APPearance CLOUDY (*)    Hgb  urine dipstick TRACE (*)    Leukocytes,Ua LARGE (*)    All other components within normal limits  URINALYSIS, MICROSCOPIC (REFLEX) - Abnormal; Notable for the following components:   Bacteria, UA MANY (*)    Trichomonas, UA PRESENT (*)    All other components within normal limits  PREGNANCY, URINE    EKG None  Radiology No results found.  Procedures Procedures   Medications Ordered in ED Medications - No data to display  ED Course  I have reviewed the triage vital signs and the nursing notes.  Pertinent labs & imaging results that were available during my care of the patient were reviewed by me and considered in my medical decision making (see chart for details).    MDM Rules/Calculators/A&P                           25 year old female presents today for evaluation of low back pain and dysuria of 3-day duration.  Patient's exam is supportive of MSK low back pain with sciatica component.  Patient's UA is significant for trichomoniasis.  Positive for leukocytosis but negative for nitrite on UA.  States she has vaginal irritation which is similar to her prior vaginal candidiasis infections. Given her timeline discussed with her the option of doing a CBC to rule out leukocytosis to ensure she does not have systemic infection with an atypical presentation of pyelonephritis.  Patient declines this option.  We will treat for trichomoniasis, vaginal candidiasis, and low back strain. symptomatic treatment discussed; strict return precautions discussed.  Discussed importance of setting up and following up with a PCP.  She voices understanding and is in  agreement with plan.  Final Clinical Impression(s) / ED Diagnoses Final diagnoses:  None    Rx / DC Orders ED Discharge Orders     None        Marita Kansas, PA-C 01/28/21 1547    Marita Kansas, PA-C 01/28/21 1554    Milagros Loll, MD 01/29/21 2222

## 2021-01-28 NOTE — Discharge Instructions (Addendum)
Take metronidazole, fluconazole, and baclofen, naproxen as discussed.   Return to the emergency department if you have worsening any back pain, develop fever, develop numbness or tingling in your vaginal area, or are unable to walk.

## 2021-01-28 NOTE — ED Triage Notes (Addendum)
Pt c/o lower back pain x 3 days-denies injury-NAD-steady gait-later added c/o urinary freq and dysuria

## 2021-02-26 ENCOUNTER — Emergency Department (HOSPITAL_BASED_OUTPATIENT_CLINIC_OR_DEPARTMENT_OTHER)
Admission: EM | Admit: 2021-02-26 | Discharge: 2021-02-27 | Disposition: A | Discharge status: Home / Self Care | Payer: Medicaid Other | Attending: Emergency Medicine | Admitting: Emergency Medicine

## 2021-02-26 ENCOUNTER — Other Ambulatory Visit: Payer: Self-pay

## 2021-02-26 ENCOUNTER — Encounter (HOSPITAL_BASED_OUTPATIENT_CLINIC_OR_DEPARTMENT_OTHER): Payer: Self-pay

## 2021-02-26 ENCOUNTER — Emergency Department (HOSPITAL_BASED_OUTPATIENT_CLINIC_OR_DEPARTMENT_OTHER): Payer: Medicaid Other

## 2021-02-26 DIAGNOSIS — R0781 Pleurodynia: Secondary | ICD-10-CM | POA: Insufficient documentation

## 2021-02-26 DIAGNOSIS — Z20822 Contact with and (suspected) exposure to covid-19: Secondary | ICD-10-CM | POA: Diagnosis not present

## 2021-02-26 DIAGNOSIS — R000 Tachycardia, unspecified: Secondary | ICD-10-CM | POA: Insufficient documentation

## 2021-02-26 DIAGNOSIS — D649 Anemia, unspecified: Secondary | ICD-10-CM | POA: Insufficient documentation

## 2021-02-26 DIAGNOSIS — M545 Low back pain, unspecified: Secondary | ICD-10-CM | POA: Insufficient documentation

## 2021-02-26 DIAGNOSIS — R590 Localized enlarged lymph nodes: Secondary | ICD-10-CM

## 2021-02-26 DIAGNOSIS — R7309 Other abnormal glucose: Secondary | ICD-10-CM | POA: Insufficient documentation

## 2021-02-26 DIAGNOSIS — R0789 Other chest pain: Secondary | ICD-10-CM

## 2021-02-26 LAB — CBC
HCT: 36.9 % (ref 36.0–46.0)
Hemoglobin: 11.1 g/dL — ABNORMAL LOW (ref 12.0–15.0)
MCH: 21.1 pg — ABNORMAL LOW (ref 26.0–34.0)
MCHC: 30.1 g/dL (ref 30.0–36.0)
MCV: 70.3 fL — ABNORMAL LOW (ref 80.0–100.0)
Platelets: 273 10*3/uL (ref 150–400)
RBC: 5.25 MIL/uL — ABNORMAL HIGH (ref 3.87–5.11)
RDW: 19 % — ABNORMAL HIGH (ref 11.5–15.5)
WBC: 6.5 10*3/uL (ref 4.0–10.5)
nRBC: 0 % (ref 0.0–0.2)

## 2021-02-26 LAB — BASIC METABOLIC PANEL
Anion gap: 10 (ref 5–15)
BUN: 9 mg/dL (ref 6–20)
CO2: 20 mmol/L — ABNORMAL LOW (ref 22–32)
Calcium: 8.8 mg/dL — ABNORMAL LOW (ref 8.9–10.3)
Chloride: 105 mmol/L (ref 98–111)
Creatinine, Ser: 0.67 mg/dL (ref 0.44–1.00)
GFR, Estimated: >60 mL/min (ref >60)
Glucose, Bld: 104 mg/dL — ABNORMAL HIGH (ref 70–99)
Potassium: 3.8 mmol/L (ref 3.5–5.1)
Sodium: 135 mmol/L (ref 135–145)

## 2021-02-26 LAB — D-DIMER, QUANTITATIVE: D-Dimer, Quant: 1.35 ug/mL-FEU — ABNORMAL HIGH (ref 0.00–0.50)

## 2021-02-26 LAB — RESP PANEL BY RT-PCR (FLU A&B, COVID) ARPGX2
Influenza A by PCR: NEGATIVE
Influenza B by PCR: NEGATIVE
SARS Coronavirus 2 by RT PCR: NEGATIVE

## 2021-02-26 LAB — TROPONIN I (HIGH SENSITIVITY)
Troponin I (High Sensitivity): <2 ng/L (ref <18)
Troponin I (High Sensitivity): <2 ng/L (ref <18)

## 2021-02-26 LAB — PREGNANCY, URINE: Preg Test, Ur: NEGATIVE

## 2021-02-26 MED ORDER — ACETAMINOPHEN 325 MG PO TABS
650.0000 mg | ORAL_TABLET | Freq: Once | ORAL | Status: AC
  Administered 2021-02-26: 650 mg via ORAL
  Filled 2021-02-26: qty 2

## 2021-02-26 NOTE — ED Notes (Signed)
Pt care taken complaining of pain in the rt flank and in chest with breathing

## 2021-02-26 NOTE — ED Provider Notes (Signed)
[facility] EMERGENCY DEPARTMENT Provider Note   CSN: [id number] Arrival date & time: 02/26/21  1930     History Chief Complaint  Patient presents with   Chest Pain    Tracey Dunn is a 25 y.o. female [facility] emergency department for pleuritic chest pain since Monday.  Patient reports her chest pain is worsened with deep inspiration and expiration, as well as yawning.  She denies any cough, sore throat, fevers, headache, nasal congestion, rhinorrhea, leg swelling or shortness of breath.  Denies any OCP.  Denies any recent URI.  Denies any chest pain similar to this in the past.  Denies any long trips or airplane rides.  Denies history of blood clots or DVTs.  Denies any falls, traumas, or MVC's.  Denies any heavy lifting.  Medical history includes sciatica.  Surgical history includes tonsillectomy.  No daily medications.  Allergic to sulfa medications.  Denies any tobacco, EtOH, or drug use.   Chest Pain Associated symptoms: back pain   Associated symptoms: no abdominal pain, no cough, no fever, no nausea, no palpitations, no shortness of breath and no vomiting       Past Medical History:  Diagnosis Date   Anemia     Patient Active Problem List   Diagnosis Date Noted   Alpha thalassemia silent carrier 02/07/2019   LGSIL on Pap smear of cervix 01/27/2019   Obesity affecting pregnancy, antepartum 01/06/2019   Overweight 09/23/2017   OSA (obstructive sleep apnea) 09/10/2016   Snoring 09/10/2016   Anemia 04/01/2015   Menorrhagia 04/01/2015   Obesity due to excess calories 04/01/2015    Past Surgical History:  Procedure Laterality Date   TONSILLECTOMY     WISDOM TOOTH EXTRACTION       OB History     Gravida  1   Para  1   Term  1   Preterm  0   AB  0   Living  1      SAB  0   IAB  0   Ectopic  0   Multiple  0   Live Births  1           Family History  Problem Relation Age of Onset   Hypertension Mother    Diabetes Mother         Type 1   Hypertension Maternal Grandmother    Cancer Neg Hx    Stroke Neg Hx     Social History   Tobacco Use   Smoking status: Never   Smokeless tobacco: Never  Vaping Use   Vaping Use: Never used  Substance Use Topics   Alcohol use: Yes    Comment: occ   Drug use: No    Home Medications Prior to Admission medications   Medication Sig Start Date End Date Taking? Authorizing Provider  AMBULATORY NON FORMULARY MEDICATION 1 Device by Other route once a week. Blood pressure cuff/Large  Monitored Regularly at home ICD 10: Z34.90 LROB Patient not taking: Reported on 07/06/2019 01/06/19   Lavonia Drafts, MD  baclofen (LIORESAL) 10 MG tablet Take 1 tablet (10 mg total) by mouth 3 (three) times daily. 01/28/21   Ali, Amjad, PA-C  budesonide (RHINOCORT AQUA) 32 MCG/ACT nasal spray Place into the nose. 09/10/16   [provider]  ibuprofen (ADVIL) 600 MG tablet Take 1 tablet (600 mg total) by mouth every 6 (six) hours. 06/05/19   Fair, Marin Shutter, MD  metroNIDAZOLE (FLAGYL) 500 MG tablet Take 1 tablet (500 mg  total) by mouth 2 (two) times daily. 01/28/21   Karie Mainland, Amjad, PA-C  naproxen (NAPROSYN) 500 MG tablet Take 1 tablet (500 mg total) by mouth 2 (two) times daily. 01/28/21   Karie Mainland, Amjad, PA-C  Norethindrone Acetate-Ethinyl Estrad-FE (LOESTRIN 24 FE) 1-20 MG-MCG(24) tablet Take 1 tablet by mouth daily. 07/06/19   Levie Heritage, DO  Prenatal Vit-Fe Fumarate-FA (PRENATAL VITAMINS PO) Take by mouth.    [provider]  cetirizine (ZYRTEC) 10 MG tablet Take 1 tablet (10 mg total) by mouth daily. Patient not taking: Reported on 07/06/2019 02/13/19 11/14/19  Levie Heritage, DO  ferrous sulfate 325 (65 FE) MG tablet Take 1 tablet (325 mg total) by mouth every other day. Patient not taking: Reported on 07/06/2019 06/06/19 11/14/19  Joselyn Arrow, MD    Allergies    Elemental sulfur and Sulfa antibiotics  Review of Systems   Review of Systems  Constitutional:  Negative  for chills and fever.  HENT:  Negative for congestion, ear pain, rhinorrhea and sore throat.   Eyes:  Negative for pain and visual disturbance.  Respiratory:  Negative for cough and shortness of breath.   Cardiovascular:  Positive for chest pain. Negative for palpitations and leg swelling.  Gastrointestinal:  Negative for abdominal pain, diarrhea, nausea and vomiting.  Genitourinary:  Negative for dysuria, hematuria, vaginal bleeding and vaginal discharge.  Musculoskeletal:  Positive for back pain. Negative for arthralgias and myalgias.  Skin:  Negative for color change and rash.  Neurological:  Negative for seizures and syncope.  All other systems reviewed and are negative.  Physical Exam Updated Vital Signs BP 114/67   Pulse 93   Temp 99.1 F (37.3 C) (Oral)   Resp (!) 28   LMP 01/28/2021   SpO2 98%   Physical Exam Vitals and nursing note reviewed.  Constitutional:      General: She is not in acute distress.    Appearance: Normal appearance. She is not toxic-appearing.  HENT:     Head: Normocephalic and atraumatic.  Eyes:     General: No scleral icterus. Cardiovascular:     Rate and Rhythm: Regular rhythm. Tachycardia present.     Heart sounds: Normal heart sounds.     Comments: Pulses intact and equal in bilateral upper and lower extremities. Pulmonary:     Effort: Pulmonary effort is normal. No tachypnea, accessory muscle usage or respiratory distress.     Breath sounds: Normal breath sounds. No decreased breath sounds.  Chest:     Chest wall: No deformity, tenderness or crepitus.     Comments: No chest wall tenderness.  No reproducible pain.  No obvious step-offs or deformities.  No overlying skin changes noted. Abdominal:     General: Abdomen is flat. Bowel sounds are normal.     Palpations: Abdomen is soft.  Musculoskeletal:        General: No deformity.     Cervical back: Normal range of motion.     Right lower leg: No tenderness. No edema.     Left lower leg:  No tenderness. No edema.     Comments: Mild right lumbar paraspinal tenderness to palpation.  No obvious step-offs or deformities.  No overlying skin changes noted.  Skin:    General: Skin is warm and dry.  Neurological:     General: No focal deficit present.     Mental Status: She is alert. Mental status is at baseline.    ED Results / Procedures / Treatments  Labs (all labs ordered are listed, but only abnormal results are displayed) Labs Reviewed  BASIC METABOLIC PANEL - Abnormal; Notable for the following components:      Result Value   CO2 20 (*)    Glucose, Bld 104 (*)    Calcium 8.8 (*)    All other components within normal limits  CBC - Abnormal; Notable for the following components:   RBC 5.25 (*)    Hemoglobin 11.1 (*)    MCV 70.3 (*)    MCH 21.1 (*)    RDW 19.0 (*)    All other components within normal limits  D-DIMER, QUANTITATIVE - Abnormal; Notable for the following components:   D-Dimer, Quant 1.35 (*)    All other components within normal limits  RESP PANEL BY RT-PCR (FLU A&B, COVID) [id number]  PREGNANCY, URINE  TROPONIN I (HIGH SENSITIVITY)  TROPONIN I (HIGH SENSITIVITY)    EKG EKG Interpretation  Date/Time:  Wednesday February 26 2021 19:43:05 EST Ventricular Rate:  100 PR Interval:  124 QRS Duration: 76 QT Interval:  330 QTC Calculation: 425 R Axis:   38 Text Interpretation: Normal sinus rhythm Normal ECG No significant change since last tracing Confirmed by Wandra Arthurs [phone]) on 02/26/2021 10:49:22 PM  Radiology DG Chest 2 View  Result Date: 02/26/2021 CLINICAL DATA:  Chest pain EXAM: CHEST - 2 VIEW COMPARISON:  11/14/2019 FINDINGS: Lungs are clear.  No pleural effusion or pneumothorax. The heart is normal in size. Visualized osseous structures are within normal limits. IMPRESSION: Normal chest radiographs. Electronically Signed   By: Julian Hy M.D.   On: 02/26/2021 19:54    Procedures Procedures   Medications Ordered in  ED Medications  acetaminophen (TYLENOL) tablet 650 mg (650 mg Oral Given 02/26/21 2310)    ED Course  I have reviewed the triage vital signs and the nursing notes.  Pertinent labs & imaging results that were available during my care of the patient were reviewed by me and considered in my medical decision making (see chart for details).  Tracey Dunn 25 year old female presenting with pleuritic chest pain for the past 2 days.  Differential diagnosis includes was not limited to PE, pneumonia, viral illness, intercostal muscle strain, COVID, flu, ACS.  I personally reviewed and interpreted the patient's labs and imaging.  Respiratory panel negative for COVID and flu.  BMP shows mildly elevated glucose, otherwise no electrolyte abnormality.  CBC shows mild anemia.  She has a history of anemia and this is actually slightly higher than her baseline.  Troponin and repeat troponin flat at less than 2.  D-dimer elevated at 1.35.  Pregnancy test negative.  Chest x-ray shows no acute cardiopulmonary process.  EKG shows normal sinus rhythm.  Tylenol ordered for pain.  Patient is tachycardic on exam so cannot be PERC negative.  D-dimer ordered and was elevated.  CT angio chest ordered to rule out pulmonary embolism.  Suspicion for ACS due to negative troponins and normal EKG.  Pneumonia or pneumothorax not visualized on chest x-ray, but may show up on CT angio, still very low suspicion for these given patient presentation.  12:22 AM Care of Tracey Dunn transferred to Dr. Florina Ou at the end of my shift as the patient will require reassessment once labs/imaging have resulted. Patient presentation, ED course, and plan of care discussed with review of all pertinent labs and imaging. Please see his/her note for further details regarding further ED course and disposition. Plan at time of handoff is pending CTA of  chest to rule out pulmonary embolism, otherwise discharged with MSK pain. This may be altered or  completely changed at the discretion of the oncoming team pending results of further workup.     MDM Rules/Calculators/A&P                          Final Clinical Impression(s) / ED Diagnoses Final diagnoses:  None    Rx / DC Orders ED Discharge Orders     None        Sherrell Puller, PA-C 02/27/21 0026    Molpus, Jenny Reichmann, MD 02/27/21 [phone]

## 2021-02-26 NOTE — ED Triage Notes (Signed)
Pt c/o CP x 3 days-denies fever/flu sx-NAD-steady gait

## 2021-02-27 ENCOUNTER — Emergency Department (HOSPITAL_BASED_OUTPATIENT_CLINIC_OR_DEPARTMENT_OTHER): Payer: Medicaid Other

## 2021-02-27 MED ORDER — IOHEXOL 350 MG/ML SOLN
75.0000 mL | Freq: Once | INTRAVENOUS | Status: AC | PRN
  Administered 2021-02-27: 75 mL via INTRAVENOUS

## 2021-02-27 NOTE — Discharge Instructions (Addendum)
Your CT scan showed lymphadenopathy (enlarged lymph nodes) in your mediastinum (the center of your chest).  This can sometimes be associated with diseases such as sarcoidosis or, less likely, cancer.  If you have a primary care physician should make an appointment with them as soon as possible.  If not.  We will refer you to a pulmonologist.

## 2021-05-28 ENCOUNTER — Other Ambulatory Visit: Payer: Self-pay

## 2021-05-28 ENCOUNTER — Emergency Department (HOSPITAL_BASED_OUTPATIENT_CLINIC_OR_DEPARTMENT_OTHER)
Admission: EM | Admit: 2021-05-28 | Discharge: 2021-05-28 | Disposition: A | Discharge status: Home / Self Care | Payer: Medicaid Other | Attending: Emergency Medicine | Admitting: Emergency Medicine

## 2021-05-28 ENCOUNTER — Encounter (HOSPITAL_BASED_OUTPATIENT_CLINIC_OR_DEPARTMENT_OTHER): Payer: Self-pay | Admitting: Emergency Medicine

## 2021-05-28 DIAGNOSIS — R0981 Nasal congestion: Secondary | ICD-10-CM | POA: Diagnosis not present

## 2021-05-28 DIAGNOSIS — H9202 Otalgia, left ear: Secondary | ICD-10-CM | POA: Diagnosis not present

## 2021-05-28 DIAGNOSIS — R42 Dizziness and giddiness: Secondary | ICD-10-CM | POA: Diagnosis present

## 2021-05-28 LAB — CBC WITH DIFFERENTIAL/PLATELET
Abs Immature Granulocytes: 0.03 10*3/uL (ref 0.00–0.07)
Basophils Absolute: 0 10*3/uL (ref 0.0–0.1)
Basophils Relative: 1 %
Eosinophils Absolute: 0.1 10*3/uL (ref 0.0–0.5)
Eosinophils Relative: 1 %
HCT: 38.3 % (ref 36.0–46.0)
Hemoglobin: 11.5 g/dL — ABNORMAL LOW (ref 12.0–15.0)
Immature Granulocytes: 1 %
Lymphocytes Relative: 31 %
Lymphs Abs: 2 10*3/uL (ref 0.7–4.0)
MCH: 21.3 pg — ABNORMAL LOW (ref 26.0–34.0)
MCHC: 30 g/dL (ref 30.0–36.0)
MCV: 71.1 fL — ABNORMAL LOW (ref 80.0–100.0)
Monocytes Absolute: 0.5 10*3/uL (ref 0.1–1.0)
Monocytes Relative: 7 %
Neutro Abs: 3.9 10*3/uL (ref 1.7–7.7)
Neutrophils Relative %: 59 %
Platelets: 252 10*3/uL (ref 150–400)
RBC: 5.39 MIL/uL — ABNORMAL HIGH (ref 3.87–5.11)
RDW: 17.4 % — ABNORMAL HIGH (ref 11.5–15.5)
WBC: 6.5 10*3/uL (ref 4.0–10.5)
nRBC: 0 % (ref 0.0–0.2)

## 2021-05-28 LAB — BASIC METABOLIC PANEL
Anion gap: 10 (ref 5–15)
BUN: 5 mg/dL — ABNORMAL LOW (ref 6–20)
CO2: 21 mmol/L — ABNORMAL LOW (ref 22–32)
Calcium: 8.6 mg/dL — ABNORMAL LOW (ref 8.9–10.3)
Chloride: 103 mmol/L (ref 98–111)
Creatinine, Ser: 0.8 mg/dL (ref 0.44–1.00)
GFR, Estimated: >60 mL/min (ref >60)
Glucose, Bld: 104 mg/dL — ABNORMAL HIGH (ref 70–99)
Potassium: 3.9 mmol/L (ref 3.5–5.1)
Sodium: 134 mmol/L — ABNORMAL LOW (ref 135–145)

## 2021-05-28 LAB — HCG, SERUM, QUALITATIVE: Preg, Serum: NEGATIVE

## 2021-05-28 MED ORDER — SODIUM CHLORIDE 0.9 % IV BOLUS
1000.0000 mL | Freq: Once | INTRAVENOUS | Status: AC
Start: 2021-05-28 — End: 2021-05-28
  Administered 2021-05-28: 1000 mL via INTRAVENOUS

## 2021-05-28 NOTE — ED Triage Notes (Signed)
Pt reports dizziness (room spinning sensation and light headedness) for the past couple weeks. Was recently treated for an ear infection, yet symptoms persist. Denies chest pain, lightheadedness, or n/v/d.

## 2021-05-28 NOTE — ED Notes (Signed)
ED Provider at bedside. 

## 2021-05-28 NOTE — Discharge Instructions (Signed)
Take tylenol 2 pills 4 times a day and motrin 4 pills 3 times a day.  Drink plenty of fluids.  Return for worsening shortness of breath, headache, confusion. Follow up with your family doctor.   

## 2021-05-28 NOTE — ED Provider Notes (Signed)
[place] EMERGENCY DEPARTMENT Provider Note   CSN: [id number] Arrival date & time: 05/28/21  [id number]     History  Chief Complaint  Patient presents with   Dizziness    Tracey Dunn is a 26 y.o. female.  26 yo F with a chief complaints of feeling lightheaded.  This has been ongoing for a few weeks now.  She has been having sinus congestion and left ear pain.  She has a son with a similar illness.  She had seen her PCP and was started on antibiotics for sinusitis.  She has just completed that but has not felt much better.  Has been eating and drinking a bit less since it thinks she may be dehydrated.  No other known sick contacts.  Other than the sinus pressure denies headache.  Denies one-sided numbness or weakness denies difficulty speech or swallowing.  Denies head trauma.   Dizziness     Home Medications Prior to Admission medications   Medication Sig Start Date End Date Taking? Authorizing Provider  baclofen (LIORESAL) 10 MG tablet Take 1 tablet (10 mg total) by mouth 3 (three) times daily. 01/28/21   Ali, Amjad, PA-C  budesonide (RHINOCORT AQUA) 32 MCG/ACT nasal spray Place into the nose. 09/10/16   [provider]  Norethindrone Acetate-Ethinyl Estrad-FE (LOESTRIN 24 FE) 1-20 MG-MCG(24) tablet Take 1 tablet by mouth daily. 07/06/19   Truett Mainland, DO  Prenatal Vit-Fe Fumarate-FA (PRENATAL VITAMINS PO) Take by mouth.    [provider]  cetirizine (ZYRTEC) 10 MG tablet Take 1 tablet (10 mg total) by mouth daily. Patient not taking: Reported on 07/06/2019 02/13/19 11/14/19  Truett Mainland, DO  ferrous sulfate 325 (65 FE) MG tablet Take 1 tablet (325 mg total) by mouth every other day. Patient not taking: Reported on 07/06/2019 06/06/19 11/14/19  Chauncey Mann, MD      Allergies    Elemental sulfur and Sulfa antibiotics    Review of Systems   Review of Systems  Neurological:  Positive for dizziness.   Physical Exam Updated Vital Signs BP  124/78    Pulse 94    Temp 98.5 F (36.9 C) (Oral)    Resp 16    SpO2 95%  Physical Exam Vitals and nursing note reviewed.  Constitutional:      General: She is not in acute distress.    Appearance: She is well-developed. She is not diaphoretic.  HENT:     Head: Normocephalic and atraumatic.     Comments: Swollen turbinates, posterior nasal drip, no noted sinus ttp, tm normal bilaterally.   Eyes:     Pupils: Pupils are equal, round, and reactive to light.  Cardiovascular:     Rate and Rhythm: Normal rate and regular rhythm.     Heart sounds: No murmur heard.   No friction rub. No gallop.  Pulmonary:     Effort: Pulmonary effort is normal.     Breath sounds: No wheezing or rales.  Abdominal:     General: There is no distension.     Palpations: Abdomen is soft.     Tenderness: There is no abdominal tenderness.  Musculoskeletal:        General: No tenderness.     Cervical back: Normal range of motion and neck supple.  Skin:    General: Skin is warm and dry.  Neurological:     Mental Status: She is alert and oriented to person, place, and time.     Cranial Nerves:  Cranial nerves 2-12 are intact.     Sensory: Sensation is intact.     Motor: Motor function is intact.     Coordination: Coordination is intact.  Psychiatric:        Behavior: Behavior normal.    ED Results / Procedures / Treatments   Labs (all labs ordered are listed, but only abnormal results are displayed) Labs Reviewed  CBC WITH DIFFERENTIAL/PLATELET - Abnormal; Notable for the following components:      Result Value   RBC 5.39 (*)    Hemoglobin 11.5 (*)    MCV 71.1 (*)    MCH 21.3 (*)    RDW 17.4 (*)    All other components within normal limits  BASIC METABOLIC PANEL - Abnormal; Notable for the following components:   Sodium 134 (*)    CO2 21 (*)    Glucose, Bld 104 (*)    BUN 5 (*)    Calcium 8.6 (*)    All other components within normal limits  HCG, SERUM, QUALITATIVE    EKG EKG  Interpretation  Date/Time:  Wednesday May 28 2021 08:42:06 EST Ventricular Rate:  101 PR Interval:  130 QRS Duration: 85 QT Interval:  325 QTC Calculation: 422 R Axis:   57 Text Interpretation: Sinus tachycardia Low voltage, precordial leads No significant change since last tracing Confirmed by Deno Etienne [phone]) on 05/28/2021 9:46:44 AM  Radiology No results found.  Procedures Procedures    Medications Ordered in ED Medications  sodium chloride 0.9 % bolus 1,000 mL (0 mLs Intravenous Stopped 05/28/21 1018)    ED Course/ Medical Decision Making/ A&P                           Medical Decision Making Amount and/or Complexity of Data Reviewed Labs: ordered.   26 yo F with a chief complaints of feeling lightheaded.  This been going on for couple weeks now.  She is well-appearing and nontoxic on my exam.  She is borderline febrile.  Tachycardic that increases significantly with sitting up.  We will give a bag of IV fluids here.  Check lab work.  She does have signs of URI.  Patient's heart rate has improved significantly on reassessment.  Likely hypovolemia.  She is feeling much better.  Lab work without significant electrolyte abnormality no significant anemia pregnancy test is negative.  We will discharge the patient home.  PCP follow-up.  10:19 AM:  I have discussed the diagnosis/risks/treatment options with the patient.  Evaluation and diagnostic testing in the emergency department does not suggest an emergent condition requiring admission or immediate intervention beyond what has been performed at this time.  They will follow up with  PCP. We also discussed returning to the ED immediately if new or worsening sx occur. We discussed the sx which are most concerning (e.g., sudden worsening pain, fever, inability to tolerate by mouth, inability to walk, stroke s/sx) that necessitate immediate return. Medications administered to the patient during their visit and any new prescriptions  provided to the patient are listed below.  Medications given during this visit Medications  sodium chloride 0.9 % bolus 1,000 mL (0 mLs Intravenous Stopped 05/28/21 1018)     The patient appears reasonably screen and/or stabilized for discharge and I doubt any other medical condition or other [facility] requiring further screening, evaluation, or treatment in the ED at this time prior to discharge.          Final  Clinical Impression(s) / ED Diagnoses Final diagnoses:  Lightheaded    Rx / DC Orders ED Discharge Orders     None         Deno Etienne, DO 05/28/21 1019

## 2021-06-04 ENCOUNTER — Emergency Department (HOSPITAL_BASED_OUTPATIENT_CLINIC_OR_DEPARTMENT_OTHER)
Admission: EM | Admit: 2021-06-04 | Discharge: 2021-06-04 | Disposition: A | Discharge status: Home / Self Care | Payer: Medicaid Other | Attending: Emergency Medicine | Admitting: Emergency Medicine

## 2021-06-04 ENCOUNTER — Other Ambulatory Visit: Payer: Self-pay

## 2021-06-04 ENCOUNTER — Encounter (HOSPITAL_BASED_OUTPATIENT_CLINIC_OR_DEPARTMENT_OTHER): Payer: Self-pay

## 2021-06-04 DIAGNOSIS — J328 Other chronic sinusitis: Secondary | ICD-10-CM | POA: Insufficient documentation

## 2021-06-04 DIAGNOSIS — J329 Chronic sinusitis, unspecified: Secondary | ICD-10-CM

## 2021-06-04 DIAGNOSIS — Z20822 Contact with and (suspected) exposure to covid-19: Secondary | ICD-10-CM | POA: Insufficient documentation

## 2021-06-04 DIAGNOSIS — R0981 Nasal congestion: Secondary | ICD-10-CM | POA: Diagnosis present

## 2021-06-04 DIAGNOSIS — B9689 Other specified bacterial agents as the cause of diseases classified elsewhere: Secondary | ICD-10-CM

## 2021-06-04 LAB — RESP PANEL BY RT-PCR (FLU A&B, COVID) ARPGX2
Influenza A by PCR: NEGATIVE
Influenza B by PCR: NEGATIVE
SARS Coronavirus 2 by RT PCR: NEGATIVE

## 2021-06-04 MED ORDER — DOXYCYCLINE HYCLATE 100 MG PO CAPS: 100.0000 mg | ORAL_CAPSULE | Freq: Two times a day (BID) | ORAL | 0 refills | Status: AC

## 2021-06-04 MED ORDER — IBUPROFEN 800 MG PO TABS
800.0000 mg | ORAL_TABLET | Freq: Once | ORAL | Status: AC
  Administered 2021-06-04: 800 mg via ORAL
  Filled 2021-06-04: qty 1

## 2021-06-04 MED ORDER — IBUPROFEN 600 MG PO TABS: 600.0000 mg | ORAL_TABLET | Freq: Four times a day (QID) | ORAL | 0 refills | Status: DC | PRN

## 2021-06-04 NOTE — ED Provider Notes (Signed)
[facility] EMERGENCY DEPARTMENT Provider Note   CSN: [id number] Arrival date & time: 06/04/21  1011     History  Chief Complaint  Patient presents with   Chills    Tracey Dunn is a 26 y.o. female presenting due to congestion.  She reports that on 1/25 she was seen by her primary care provider and diagnosed with a double ear infection as well as sinus bacterial sinusitis.  She was treated with Augmentin.  She says that she continues to feel poorly.  Denies any chest pain or difficulty breathing outside of if she coughs.  No history of DVT, estrogen use, recent surgery or travel.  Reports that she started a new SSRI on Sunday and wonders if this could be the cause of her symptoms.   Home Medications Prior to Admission medications   Medication Sig Start Date End Date Taking? Authorizing Provider  doxycycline (VIBRAMYCIN) 100 MG capsule Take 1 capsule (100 mg total) by mouth 2 (two) times daily for 7 days. 06/04/21 06/11/21 Yes Lenay Lovejoy A, PA-C  ibuprofen (ADVIL) 600 MG tablet Take 1 tablet (600 mg total) by mouth every 6 (six) hours as needed. 06/04/21  Yes Ines Warf A, PA-C  baclofen (LIORESAL) 10 MG tablet Take 1 tablet (10 mg total) by mouth 3 (three) times daily. 01/28/21   Ali, Amjad, PA-C  budesonide (RHINOCORT AQUA) 32 MCG/ACT nasal spray Place into the nose. 09/10/16   [provider]  Norethindrone Acetate-Ethinyl Estrad-FE (LOESTRIN 24 FE) 1-20 MG-MCG(24) tablet Take 1 tablet by mouth daily. 07/06/19   Levie Heritage, DO  Prenatal Vit-Fe Fumarate-FA (PRENATAL VITAMINS PO) Take by mouth.    [provider]  cetirizine (ZYRTEC) 10 MG tablet Take 1 tablet (10 mg total) by mouth daily. Patient not taking: Reported on 07/06/2019 02/13/19 11/14/19  Levie Heritage, DO  ferrous sulfate 325 (65 FE) MG tablet Take 1 tablet (325 mg total) by mouth every other day. Patient not taking: Reported on 07/06/2019 06/06/19 11/14/19  Joselyn Arrow, MD       Allergies    Elemental sulfur and Sulfa antibiotics    Review of Systems   Review of Systems  Physical Exam Updated Vital Signs BP 107/60    Pulse (!) 104    Temp (!) 102.5 F (39.2 C) (Oral)    Resp 18    Ht 5\' 3"  (1.6 m)    Wt 127.5 kg    LMP 05/28/2021 (Approximate)    SpO2 96%    BMI 49.78 kg/m  Physical Exam Vitals and nursing note reviewed.  Constitutional:      General: She is not in acute distress.    Appearance: Normal appearance. She is not ill-appearing.  HENT:     Head: Normocephalic and atraumatic.     Right Ear: Tympanic membrane normal.     Left Ear: Tympanic membrane normal.     Nose: Nose normal.     Comments: Mild frontal sinus tenderness to palpation.    Mouth/Throat:     Mouth: Mucous membranes are moist.     Pharynx: Oropharynx is clear.  Eyes:     General: No scleral icterus.    Conjunctiva/sclera: Conjunctivae normal.  Cardiovascular:     Rate and Rhythm: Normal rate and regular rhythm.  Pulmonary:     Effort: Pulmonary effort is normal. No respiratory distress.     Breath sounds: No wheezing.  Skin:    General: Skin is warm and dry.  Findings: No rash.  Neurological:     Mental Status: She is alert.  Psychiatric:        Mood and Affect: Mood normal.    ED Results / Procedures / Treatments   Labs (all labs ordered are listed, but only abnormal results are displayed) Labs Reviewed  RESP PANEL BY RT-PCR (FLU A&B, COVID) [id number]    EKG None  Radiology No results found.  Procedures Procedures    Medications Ordered in ED Medications  ibuprofen (ADVIL) tablet 800 mg (800 mg Oral Given 06/04/21 1126)    ED Course/ Medical Decision Making/ A&P                           Medical Decision Making Risk Prescription drug management.   26 year old female presenting due to congestion and fevers.  This has been ongoing since the end of January.  She was treated with Augmentin without resolution of her symptoms.  Physical exam:  Grossly unremarkable.  No longer has ear infection.  Some tenderness to palpation of sinuses.  COVID and flu were ordered and both negative  I considered ordering a d dimer due to her cough, fever and occasional tachycardia however atient is PERC negative.  I believe her symptoms are more in line with continued symptoms from her sinusitis.  Disposition: At this time I believe patient needs to be seen by her primary care provider.  In the meantime, I will prescribe doxycycline to widen the coverage of her sinusitis.  She is agreeable to this plan.  Final Clinical Impression(s) / ED Diagnoses Final diagnoses:  Bacterial sinusitis    Rx / DC Orders ED Discharge Orders          Ordered    doxycycline (VIBRAMYCIN) 100 MG capsule  2 times daily        06/04/21 1307    ibuprofen (ADVIL) 600 MG tablet  Every 6 hours PRN        06/04/21 1307           Results and diagnoses were explained to the patient. Return precautions discussed in full. Patient had no additional questions and expressed complete understanding.   This chart was dictated using voice recognition software.  Despite best efforts to proofread,  errors can occur which can change the documentation meaning.    Saddie Benders, PA-C 06/04/21 1318    Ernie Avena, MD 06/05/21 [phone]

## 2021-06-04 NOTE — Discharge Instructions (Addendum)
You no longer have an ear infection.  Please read the information about sinusitis attached to these discharge papers.  The antibiotic, doxycycline and ibuprofen have both been sent to your pharmacy.  Please follow-up with your primary care provider about your symptoms.

## 2021-06-04 NOTE — ED Triage Notes (Signed)
Pt c/o chills, nausea-states she was seen last week and dx "with viral infection and ear infection"-completed augmentin for sinus infection 2 weeks ago-NAD-steady gait

## 2021-07-07 ENCOUNTER — Other Ambulatory Visit: Payer: Self-pay

## 2021-07-07 ENCOUNTER — Emergency Department (HOSPITAL_BASED_OUTPATIENT_CLINIC_OR_DEPARTMENT_OTHER): Payer: Medicaid Other

## 2021-07-07 ENCOUNTER — Emergency Department (HOSPITAL_BASED_OUTPATIENT_CLINIC_OR_DEPARTMENT_OTHER)
Admission: EM | Admit: 2021-07-07 | Discharge: 2021-07-07 | Disposition: A | Discharge status: Home / Self Care | Payer: Medicaid Other | Attending: Emergency Medicine | Admitting: Emergency Medicine

## 2021-07-07 ENCOUNTER — Encounter (HOSPITAL_BASED_OUTPATIENT_CLINIC_OR_DEPARTMENT_OTHER): Payer: Self-pay

## 2021-07-07 DIAGNOSIS — R079 Chest pain, unspecified: Secondary | ICD-10-CM | POA: Diagnosis not present

## 2021-07-07 DIAGNOSIS — M5432 Sciatica, left side: Secondary | ICD-10-CM | POA: Diagnosis not present

## 2021-07-07 DIAGNOSIS — M79605 Pain in left leg: Secondary | ICD-10-CM | POA: Diagnosis present

## 2021-07-07 DIAGNOSIS — M79602 Pain in left arm: Secondary | ICD-10-CM | POA: Diagnosis not present

## 2021-07-07 DIAGNOSIS — R110 Nausea: Secondary | ICD-10-CM | POA: Diagnosis not present

## 2021-07-07 HISTORY — DX: Sciatica, unspecified side: M54.30

## 2021-07-07 LAB — CBC WITH DIFFERENTIAL/PLATELET
Abs Immature Granulocytes: 0.01 10*3/uL (ref 0.00–0.07)
Basophils Absolute: 0 10*3/uL (ref 0.0–0.1)
Basophils Relative: 1 %
Eosinophils Absolute: 0.3 10*3/uL (ref 0.0–0.5)
Eosinophils Relative: 6 %
HCT: 37 % (ref 36.0–46.0)
Hemoglobin: 11.1 g/dL — ABNORMAL LOW (ref 12.0–15.0)
Immature Granulocytes: 0 %
Lymphocytes Relative: 39 %
Lymphs Abs: 2 10*3/uL (ref 0.7–4.0)
MCH: 21.5 pg — ABNORMAL LOW (ref 26.0–34.0)
MCHC: 30 g/dL (ref 30.0–36.0)
MCV: 71.7 fL — ABNORMAL LOW (ref 80.0–100.0)
Monocytes Absolute: 0.5 10*3/uL (ref 0.1–1.0)
Monocytes Relative: 10 %
Neutro Abs: 2.4 10*3/uL (ref 1.7–7.7)
Neutrophils Relative %: 44 %
Platelets: 265 10*3/uL (ref 150–400)
RBC: 5.16 MIL/uL — ABNORMAL HIGH (ref 3.87–5.11)
RDW: 17.5 % — ABNORMAL HIGH (ref 11.5–15.5)
WBC: 5.3 10*3/uL (ref 4.0–10.5)
nRBC: 0 % (ref 0.0–0.2)

## 2021-07-07 LAB — COMPREHENSIVE METABOLIC PANEL
ALT: 23 U/L (ref 0–44)
AST: 22 U/L (ref 15–41)
Albumin: 3.2 g/dL — ABNORMAL LOW (ref 3.5–5.0)
Alkaline Phosphatase: 73 U/L (ref 38–126)
Anion gap: 5 (ref 5–15)
BUN: 13 mg/dL (ref 6–20)
CO2: 24 mmol/L (ref 22–32)
Calcium: 8.3 mg/dL — ABNORMAL LOW (ref 8.9–10.3)
Chloride: 111 mmol/L (ref 98–111)
Creatinine, Ser: 0.82 mg/dL (ref 0.44–1.00)
GFR, Estimated: >60 mL/min (ref >60)
Glucose, Bld: 106 mg/dL — ABNORMAL HIGH (ref 70–99)
Potassium: 4.3 mmol/L (ref 3.5–5.1)
Sodium: 140 mmol/L (ref 135–145)
Total Bilirubin: 0.3 mg/dL (ref 0.3–1.2)
Total Protein: 7.2 g/dL (ref 6.5–8.1)

## 2021-07-07 LAB — TROPONIN I (HIGH SENSITIVITY): Troponin I (High Sensitivity): <2 ng/L (ref <18)

## 2021-07-07 LAB — PREGNANCY, URINE: Preg Test, Ur: NEGATIVE

## 2021-07-07 MED ORDER — METHOCARBAMOL 500 MG PO TABS: 500.0000 mg | ORAL_TABLET | Freq: Three times a day (TID) | ORAL | 0 refills | Status: AC | PRN

## 2021-07-07 MED ORDER — DEXAMETHASONE SODIUM PHOSPHATE 10 MG/ML IJ SOLN
10.0000 mg | Freq: Once | INTRAMUSCULAR | Status: AC
  Administered 2021-07-07: 10 mg via INTRAVENOUS
  Filled 2021-07-07: qty 1

## 2021-07-07 MED ORDER — PANTOPRAZOLE SODIUM 40 MG PO TBEC: 40.0000 mg | DELAYED_RELEASE_TABLET | Freq: Every day | ORAL | 0 refills | Status: AC

## 2021-07-07 NOTE — ED Provider Notes (Signed)
?[facility] EMERGENCY DEPARTMENT ?Provider Note ? ? ?CSN: [id number] ?Arrival date & time: 07/07/21  0801 ? ?  ? ?History ? ?Chief Complaint  ?Patient presents with  ? Chest Pain  ? ? ?Tracey Dunn is a 26 y.o. female. ? ?HPI ? ?  ? ? ?Presents with leg, arm, chest pain ? ?Leg and arm pain began 3 days ago ?Yesterday began to have chest pain ?Chest pain is a burning pain, left side of chest, doesn't necessarily radiate, not worse with deep breaths, not worse with changing positions, not worse with exertion, not worse after eating, on and off ?No dyspnea ?Has had some nausea, not sure if because cycle coming on ?Aching pain in arm, worse with moving it into certain positions, difficulty picking up son on that side because it hurts (2yo) ?Aching pain also in leg, shooting pain from ankle to back of leg to thigh ?Not having back pain now, does have history of sciatica, this part feels the same ?No falls or trauma, new numbness/weakness, no loss of control of bowel or bladder ? ?No recent cough, fever ?Has had some occasional sinus symptoms ?  ?No smoking, etoh, other drugs ?No fam hx of early CAD, DVT/PE ? ?No long trips, surgeries, no estrogen ? ? ?Past Medical History:  ?Diagnosis Date  ? Anemia   ? Sciatica   ?  ? ?Home Medications ?Prior to Admission medications   ?Medication Sig Start Date End Date Taking? Authorizing Provider  ?methocarbamol (ROBAXIN) 500 MG tablet Take 1 tablet (500 mg total) by mouth every 8 (eight) hours as needed for muscle spasms. 07/07/21  Yes Alvira Monday, MD  ?pantoprazole (PROTONIX) 40 MG tablet Take 1 tablet (40 mg total) by mouth daily for 14 days. 07/07/21 07/21/21 Yes Alvira Monday, MD  ?budesonide (RHINOCORT AQUA) 32 MCG/ACT nasal spray Place into the nose. 09/10/16   [provider]  ?Prenatal Vit-Fe Fumarate-FA (PRENATAL VITAMINS PO) Take by mouth.    [provider]  ?cetirizine (ZYRTEC) 10 MG tablet Take 1 tablet (10 mg total) by mouth  daily. ?Patient not taking: Reported on 07/06/2019 02/13/19 11/14/19  Levie Heritage, DO  ?ferrous sulfate 325 (65 FE) MG tablet Take 1 tablet (325 mg total) by mouth every other day. ?Patient not taking: Reported on 07/06/2019 06/06/19 11/14/19  Joselyn Arrow, MD  ?   ? ?Allergies    ?Elemental sulfur and Sulfa antibiotics   ? ?Review of Systems   ?Review of Systems ?See above ?Physical Exam ?Updated Vital Signs ?BP 126/84   Pulse 82   Temp 98.6 ?F (37 ?C) (Oral)   Resp 18   Ht 5\' 3"  (1.6 m)   Wt 127.9 kg   LMP 07/07/2021 (Exact Date)   SpO2 99%   BMI 49.95 kg/m?  ?Physical Exam ?Vitals and nursing note reviewed.  ?Constitutional:   ?   General: She is not in acute distress. ?   Appearance: She is well-developed. She is not diaphoretic.  ?HENT:  ?   Head: Normocephalic and atraumatic.  ?Eyes:  ?   Conjunctiva/sclera: Conjunctivae normal.  ?Cardiovascular:  ?   Rate and Rhythm: Normal rate and regular rhythm.  ?   Heart sounds: Normal heart sounds. No murmur heard. ?  No friction rub. No gallop.  ?Pulmonary:  ?   Effort: Pulmonary effort is normal. No respiratory distress.  ?   Breath sounds: Normal breath sounds. No wheezing or rales.  ?Abdominal:  ?   General: There is  no distension.  ?   Palpations: Abdomen is soft.  ?   Tenderness: There is no abdominal tenderness. There is no guarding.  ?Musculoskeletal:     ?   General: No tenderness.  ?   Cervical back: Normal range of motion.  ?Skin: ?   General: Skin is warm and dry.  ?   Findings: No erythema or rash.  ?Neurological:  ?   Mental Status: She is alert and oriented to person, place, and time.  ?   Cranial Nerves: No dysarthria or facial asymmetry.  ?   Sensory: Sensation is intact. No sensory deficit.  ?   Motor: Motor function is intact. No weakness.  ? ? ?ED Results / Procedures / Treatments   ?Labs ?(all labs ordered are listed, but only abnormal results are displayed) ?Labs Reviewed  ?CBC WITH DIFFERENTIAL/PLATELET - Abnormal; Notable for the  following components:  ?    Result Value  ? RBC 5.16 (*)   ? Hemoglobin 11.1 (*)   ? MCV 71.7 (*)   ? MCH 21.5 (*)   ? RDW 17.5 (*)   ? All other components within normal limits  ?COMPREHENSIVE METABOLIC PANEL - Abnormal; Notable for the following components:  ? Glucose, Bld 106 (*)   ? Calcium 8.3 (*)   ? Albumin 3.2 (*)   ? All other components within normal limits  ?PREGNANCY, URINE  ?TROPONIN I (HIGH SENSITIVITY)  ?TROPONIN I (HIGH SENSITIVITY)  ? ? ?EKG ?EKG Interpretation ? ?Date/Time:  Monday July 07 2021 08:14:21 EDT ?Ventricular Rate:  93 ?PR Interval:  134 ?QRS Duration: 84 ?QT Interval:  346 ?QTC Calculation: 431 ?R Axis:   54 ?Text Interpretation: Sinus rhythm Low voltage, precordial leads No significant change since last tracing Confirmed by Alvira Monday ([id number]) on 07/07/2021 8:55:59 AM ? ?Radiology ?DG Chest 2 View ? ?Result Date: 07/07/2021 ?CLINICAL DATA:  Chest pain EXAM: CHEST - 2 VIEW COMPARISON:  02/26/2021 FINDINGS: Cardiac size is within normal limits. Faint hazy increased density is seen in the right upper lung fields. This may suggest pneumonia. Part of this finding may be due to chest wall attenuation. There are no signs of alveolar pulmonary edema. Peribronchial thickening is seen. There is no pleural effusion or pneumothorax. IMPRESSION: Faint increased density in the right upper lung fields may suggest pneumonia. There is no pleural effusion. Electronically Signed   By: Ernie Avena M.D.   On: 07/07/2021 10:12   ? ?Procedures ?Procedures  ? ? ?Medications Ordered in ED ?Medications  ?dexamethasone (DECADRON) injection 10 mg (has no administration in time range)  ? ? ?ED Course/ Medical Decision Making/ A&P ?  ?                        ?Medical Decision Making ?Amount and/or Complexity of Data Reviewed ?Labs: ordered. ?Radiology: ordered. ? ? ?26 year old female with history of anemia, sciatica, elevated BMI presents with concern of chest pain, arm pain, leg pain. ? ?Differential  diagnosis for chest pain includes pulmonary embolus, dissection, pneumothorax, pneumonia, ACS, myocarditis, pericarditis.   ? ?EKG was done and evaluate by me and showed no acute ST changes and no signs of pericarditis. Chest x-ray was done and evaluated by me and radiology and showed a haziness which may be chest wall attenuation vs pneumonia, clinically have low suspicion for pneumonia.  ? ?Patient is low risk [place], no dyspnea, no estrogen, no tachycardia, no tachypnea, no pleuritic chest pain, no asymmetric leg  swelling, no hx of dVT/PE/immobilization and have low suspicion for PE.  Patient is low risk HEART score troponin negative with symptoms starting yesterday.  Do not feel history or exam are consistent with aortic dissection, normal pulses, history not consistent.   ? ?She has normal pulses, do not suspect arterial thrombus. Do not suspect DVT given no asymmetric swelling. ? ?Has history of sciatica in the past and has lower back tenderness and radiation of pain consistent with this-will give dose of decadron.  Shoulder and arm also tender to palpation, possible muscular strain or cervical radiculopathy.   ? ?Given rx for robaxin, recommend PCP follow up. Patient discharged in stable condition with understanding of reasons to return.  ? ? ? ? ? ? ? ? ? ?Final Clinical Impression(s) / ED Diagnoses ?Final diagnoses:  ?Chest pain, unspecified type  ?Pain of left upper extremity  ?Sciatica of left side  ? ? ?Rx / DC Orders ?ED Discharge Orders   ? ?      Ordered  ?  methocarbamol (ROBAXIN) 500 MG tablet  Every 8 hours PRN       ? 07/07/21 1042  ?  pantoprazole (PROTONIX) 40 MG tablet  Daily       ? 07/07/21 1042  ? ?  ?  ? ?  ? ? ?  ?Alvira MondaySchlossman, Tung Pustejovsky, MD ?07/07/21 1043 ? ?

## 2021-07-07 NOTE — ED Triage Notes (Signed)
Pt reports left arm and leg pain for 3 days. Left shoulder heaviness. Thought possibly sciatica, then burning in chest that began yesterday ?

## 2021-08-08 ENCOUNTER — Other Ambulatory Visit: Payer: Self-pay | Admitting: Family Medicine

## 2021-10-09 ENCOUNTER — Encounter (HOSPITAL_BASED_OUTPATIENT_CLINIC_OR_DEPARTMENT_OTHER): Payer: Self-pay

## 2021-10-09 ENCOUNTER — Inpatient Hospital Stay (HOSPITAL_BASED_OUTPATIENT_CLINIC_OR_DEPARTMENT_OTHER)
Admission: EM | Admit: 2021-10-09 | Discharge: 2021-10-14 | DRG: 871 | Disposition: A | Discharge status: Home / Self Care | Payer: Medicaid Other | Attending: Internal Medicine | Admitting: Internal Medicine

## 2021-10-09 ENCOUNTER — Emergency Department (HOSPITAL_BASED_OUTPATIENT_CLINIC_OR_DEPARTMENT_OTHER): Payer: Medicaid Other

## 2021-10-09 ENCOUNTER — Other Ambulatory Visit: Payer: Self-pay

## 2021-10-09 DIAGNOSIS — Z6841 Body Mass Index (BMI) 40.0 and over, adult: Secondary | ICD-10-CM

## 2021-10-09 DIAGNOSIS — A419 Sepsis, unspecified organism: Principal | ICD-10-CM | POA: Diagnosis present

## 2021-10-09 DIAGNOSIS — E66813 Obesity, class 3: Secondary | ICD-10-CM

## 2021-10-09 DIAGNOSIS — R0600 Dyspnea, unspecified: Secondary | ICD-10-CM | POA: Diagnosis present

## 2021-10-09 DIAGNOSIS — J45909 Unspecified asthma, uncomplicated: Secondary | ICD-10-CM | POA: Diagnosis present

## 2021-10-09 DIAGNOSIS — Z79899 Other long term (current) drug therapy: Secondary | ICD-10-CM

## 2021-10-09 DIAGNOSIS — D509 Iron deficiency anemia, unspecified: Secondary | ICD-10-CM | POA: Diagnosis present

## 2021-10-09 DIAGNOSIS — Z833 Family history of diabetes mellitus: Secondary | ICD-10-CM

## 2021-10-09 DIAGNOSIS — J189 Pneumonia, unspecified organism: Secondary | ICD-10-CM | POA: Diagnosis not present

## 2021-10-09 DIAGNOSIS — Z20822 Contact with and (suspected) exposure to covid-19: Secondary | ICD-10-CM | POA: Diagnosis present

## 2021-10-09 DIAGNOSIS — J329 Chronic sinusitis, unspecified: Secondary | ICD-10-CM | POA: Diagnosis present

## 2021-10-09 DIAGNOSIS — E876 Hypokalemia: Secondary | ICD-10-CM | POA: Diagnosis present

## 2021-10-09 DIAGNOSIS — J9601 Acute respiratory failure with hypoxia: Secondary | ICD-10-CM | POA: Diagnosis present

## 2021-10-09 DIAGNOSIS — Z7951 Long term (current) use of inhaled steroids: Secondary | ICD-10-CM

## 2021-10-09 DIAGNOSIS — Z882 Allergy status to sulfonamides status: Secondary | ICD-10-CM

## 2021-10-09 DIAGNOSIS — Z8249 Family history of ischemic heart disease and other diseases of the circulatory system: Secondary | ICD-10-CM

## 2021-10-09 LAB — STREP PNEUMONIAE URINARY ANTIGEN: Strep Pneumo Urinary Antigen: NEGATIVE

## 2021-10-09 LAB — URINALYSIS, ROUTINE W REFLEX MICROSCOPIC
Bilirubin Urine: NEGATIVE
Glucose, UA: NEGATIVE mg/dL
Hgb urine dipstick: NEGATIVE
Ketones, ur: 20 mg/dL — AB
Leukocytes,Ua: NEGATIVE
Nitrite: NEGATIVE
Protein, ur: 30 mg/dL — AB
Specific Gravity, Urine: >1.046 — ABNORMAL HIGH (ref 1.005–1.030)
pH: 5 (ref 5.0–8.0)

## 2021-10-09 LAB — CBC WITH DIFFERENTIAL/PLATELET
Abs Immature Granulocytes: 0.02 10*3/uL (ref 0.00–0.07)
Basophils Absolute: 0 10*3/uL (ref 0.0–0.1)
Basophils Relative: 1 %
Eosinophils Absolute: 0.1 10*3/uL (ref 0.0–0.5)
Eosinophils Relative: 2 %
HCT: 37.8 % (ref 36.0–46.0)
Hemoglobin: 11.7 g/dL — ABNORMAL LOW (ref 12.0–15.0)
Immature Granulocytes: 0 %
Lymphocytes Relative: 17 %
Lymphs Abs: 1.1 10*3/uL (ref 0.7–4.0)
MCH: 21.6 pg — ABNORMAL LOW (ref 26.0–34.0)
MCHC: 31 g/dL (ref 30.0–36.0)
MCV: 69.9 fL — ABNORMAL LOW (ref 80.0–100.0)
Monocytes Absolute: 0.6 10*3/uL (ref 0.1–1.0)
Monocytes Relative: 9 %
Neutro Abs: 4.7 10*3/uL (ref 1.7–7.7)
Neutrophils Relative %: 71 %
Platelets: 205 10*3/uL (ref 150–400)
RBC: 5.41 MIL/uL — ABNORMAL HIGH (ref 3.87–5.11)
RDW: 19.1 % — ABNORMAL HIGH (ref 11.5–15.5)
WBC: 6.5 10*3/uL (ref 4.0–10.5)
nRBC: 0 % (ref 0.0–0.2)

## 2021-10-09 LAB — TROPONIN I (HIGH SENSITIVITY)
Troponin I (High Sensitivity): 2 ng/L (ref <18)
Troponin I (High Sensitivity): 2 ng/L (ref <18)

## 2021-10-09 LAB — BRAIN NATRIURETIC PEPTIDE: B Natriuretic Peptide: 9.5 pg/mL (ref 0.0–100.0)

## 2021-10-09 LAB — COMPREHENSIVE METABOLIC PANEL
ALT: 24 U/L (ref 0–44)
AST: 38 U/L (ref 15–41)
Albumin: 3.1 g/dL — ABNORMAL LOW (ref 3.5–5.0)
Alkaline Phosphatase: 67 U/L (ref 38–126)
Anion gap: 8 (ref 5–15)
BUN: 7 mg/dL (ref 6–20)
CO2: 25 mmol/L (ref 22–32)
Calcium: 8.5 mg/dL — ABNORMAL LOW (ref 8.9–10.3)
Chloride: 104 mmol/L (ref 98–111)
Creatinine, Ser: 0.88 mg/dL (ref 0.44–1.00)
GFR, Estimated: >60 mL/min (ref >60)
Glucose, Bld: 118 mg/dL — ABNORMAL HIGH (ref 70–99)
Potassium: 3.3 mmol/L — ABNORMAL LOW (ref 3.5–5.1)
Sodium: 137 mmol/L (ref 135–145)
Total Bilirubin: 0.7 mg/dL (ref 0.3–1.2)
Total Protein: 7.3 g/dL (ref 6.5–8.1)

## 2021-10-09 LAB — RESP PANEL BY RT-PCR (FLU A&B, COVID) ARPGX2
Influenza A by PCR: NEGATIVE
Influenza B by PCR: NEGATIVE
SARS Coronavirus 2 by RT PCR: NEGATIVE

## 2021-10-09 LAB — PREGNANCY, URINE: Preg Test, Ur: NEGATIVE

## 2021-10-09 LAB — LACTIC ACID, PLASMA: Lactic Acid, Venous: 1.4 mmol/L (ref 0.5–1.9)

## 2021-10-09 LAB — MAGNESIUM: Magnesium: 1.8 mg/dL (ref 1.7–2.4)

## 2021-10-09 LAB — MRSA NEXT GEN BY PCR, NASAL: MRSA by PCR Next Gen: NOT DETECTED

## 2021-10-09 MED ORDER — LACTATED RINGERS IV BOLUS
1000.0000 mL | Freq: Once | INTRAVENOUS | Status: AC
  Administered 2021-10-09: 1000 mL via INTRAVENOUS

## 2021-10-09 MED ORDER — VANCOMYCIN HCL 1250 MG/250ML IV SOLN
1250.0000 mg | Freq: Two times a day (BID) | INTRAVENOUS | Status: DC
  Administered 2021-10-10: 1250 mg via INTRAVENOUS
  Filled 2021-10-09: qty 250

## 2021-10-09 MED ORDER — SODIUM CHLORIDE 0.9 % IV SOLN
1.0000 g | Freq: Once | INTRAVENOUS | Status: AC
  Administered 2021-10-09: 1 g via INTRAVENOUS
  Filled 2021-10-09: qty 10

## 2021-10-09 MED ORDER — ACETAMINOPHEN 325 MG PO TABS
650.0000 mg | ORAL_TABLET | Freq: Four times a day (QID) | ORAL | Status: DC | PRN
  Administered 2021-10-09 – 2021-10-12 (×6): 650 mg via ORAL
  Filled 2021-10-09 (×6): qty 2

## 2021-10-09 MED ORDER — POTASSIUM CHLORIDE CRYS ER 20 MEQ PO TBCR
40.0000 meq | EXTENDED_RELEASE_TABLET | Freq: Every day | ORAL | Status: DC
  Administered 2021-10-09 – 2021-10-11 (×3): 40 meq via ORAL
  Filled 2021-10-09 (×3): qty 2

## 2021-10-09 MED ORDER — IBUPROFEN 400 MG PO TABS
400.0000 mg | ORAL_TABLET | Freq: Once | ORAL | Status: AC
  Administered 2021-10-09: 400 mg via ORAL
  Filled 2021-10-09: qty 1

## 2021-10-09 MED ORDER — ENOXAPARIN SODIUM 60 MG/0.6ML IJ SOSY
60.0000 mg | PREFILLED_SYRINGE | INTRAMUSCULAR | Status: DC
  Administered 2021-10-09 – 2021-10-13 (×5): 60 mg via SUBCUTANEOUS
  Filled 2021-10-09 (×5): qty 0.6

## 2021-10-09 MED ORDER — VANCOMYCIN HCL IN DEXTROSE 1-5 GM/200ML-% IV SOLN: 1000.0000 mg | Freq: Once | INTRAVENOUS | Status: DC

## 2021-10-09 MED ORDER — ONDANSETRON HCL 4 MG/2ML IJ SOLN
4.0000 mg | Freq: Once | INTRAMUSCULAR | Status: AC
  Administered 2021-10-09: 4 mg via INTRAVENOUS
  Filled 2021-10-09: qty 2

## 2021-10-09 MED ORDER — IBUPROFEN 200 MG PO TABS
600.0000 mg | ORAL_TABLET | Freq: Four times a day (QID) | ORAL | Status: DC | PRN
  Administered 2021-10-10: 600 mg via ORAL
  Filled 2021-10-09 (×2): qty 1

## 2021-10-09 MED ORDER — SODIUM CHLORIDE 0.9 % IV SOLN
INTRAVENOUS | Status: DC
Start: 2021-10-09 — End: 2021-10-10

## 2021-10-09 MED ORDER — ACETAMINOPHEN 500 MG PO TABS
1000.0000 mg | ORAL_TABLET | Freq: Once | ORAL | Status: AC
  Administered 2021-10-09: 1000 mg via ORAL
  Filled 2021-10-09: qty 2

## 2021-10-09 MED ORDER — ONDANSETRON HCL 4 MG/2ML IJ SOLN
4.0000 mg | Freq: Four times a day (QID) | INTRAMUSCULAR | Status: DC | PRN
  Administered 2021-10-10 – 2021-10-12 (×4): 4 mg via INTRAVENOUS
  Filled 2021-10-09 (×4): qty 2

## 2021-10-09 MED ORDER — ONDANSETRON HCL 4 MG PO TABS
4.0000 mg | ORAL_TABLET | Freq: Four times a day (QID) | ORAL | Status: DC | PRN
  Administered 2021-10-09: 4 mg via ORAL
  Filled 2021-10-09: qty 1

## 2021-10-09 MED ORDER — METOCLOPRAMIDE HCL 5 MG/ML IJ SOLN
10.0000 mg | Freq: Once | INTRAMUSCULAR | Status: AC
  Administered 2021-10-10: 10 mg via INTRAVENOUS
  Filled 2021-10-09: qty 2

## 2021-10-09 MED ORDER — AZITHROMYCIN 250 MG PO TABS
500.0000 mg | ORAL_TABLET | Freq: Once | ORAL | Status: AC
  Administered 2021-10-09: 500 mg via ORAL
  Filled 2021-10-09: qty 2

## 2021-10-09 MED ORDER — IOHEXOL 300 MG/ML  SOLN
100.0000 mL | Freq: Once | INTRAMUSCULAR | Status: AC | PRN
  Administered 2021-10-09: 100 mL via INTRAVENOUS

## 2021-10-09 MED ORDER — FENTANYL CITRATE PF 50 MCG/ML IJ SOSY
50.0000 ug | PREFILLED_SYRINGE | Freq: Once | INTRAMUSCULAR | Status: DC
  Filled 2021-10-09: qty 1

## 2021-10-09 MED ORDER — SODIUM CHLORIDE 0.9 % IV SOLN: 2.0000 g | Freq: Once | INTRAVENOUS | Status: DC

## 2021-10-09 MED ORDER — ACETAMINOPHEN 500 MG PO TABS
1000.0000 mg | ORAL_TABLET | Freq: Once | ORAL | Status: AC
Start: 2021-10-09 — End: 2021-10-09
  Administered 2021-10-09: 1000 mg via ORAL
  Filled 2021-10-09: qty 2

## 2021-10-09 MED ORDER — ACETAMINOPHEN 650 MG RE SUPP: 650.0000 mg | Freq: Four times a day (QID) | RECTAL | Status: DC | PRN

## 2021-10-09 MED ORDER — ALBUTEROL SULFATE (2.5 MG/3ML) 0.083% IN NEBU: 2.5000 mg | INHALATION_SOLUTION | RESPIRATORY_TRACT | Status: DC | PRN

## 2021-10-09 MED ORDER — VANCOMYCIN HCL 2000 MG/400ML IV SOLN
2000.0000 mg | Freq: Once | INTRAVENOUS | Status: AC
  Administered 2021-10-09: 2000 mg via INTRAVENOUS
  Filled 2021-10-09: qty 400

## 2021-10-09 MED ORDER — GUAIFENESIN ER 600 MG PO TB12
600.0000 mg | ORAL_TABLET | Freq: Two times a day (BID) | ORAL | Status: DC
  Administered 2021-10-09 – 2021-10-14 (×10): 600 mg via ORAL
  Filled 2021-10-09 (×10): qty 1

## 2021-10-09 MED ORDER — SODIUM CHLORIDE 0.9 % IV SOLN
2.0000 g | Freq: Three times a day (TID) | INTRAVENOUS | Status: DC
  Administered 2021-10-09 – 2021-10-14 (×14): 2 g via INTRAVENOUS
  Filled 2021-10-09 (×14): qty 12.5

## 2021-10-09 NOTE — Plan of Care (Signed)
This is a 26 year old female with history of asthma and sinusitis who presented to [facility] with shortness of breath which apparently has been going on for months but has gotten worse over the last 24 hours.  She was febrile with 102.9 and tachycardic as well as tachypneic.  Chest x-ray showed vascular congestion, pulm edema and possible pneumonia.  CT chest confirmed bilateral pneumonia.  She meets criteria for severe sepsis.  She is hypoxic as well requiring 2 L of oxygen.  She has received Rocephin and Zithromax in the ED.  Per ED physician's report, patient was initially not willing to stay in the hospital but due to hypoxia and recurrence of the fever, she has now agreed to be hospitalized.  Hospital service were consulted to accept the patient.  Patient has been accepted to MedSurg unit at [facility].  Hospitalist service will resume care once patient arrives on the floor.  Prior to that, all patient care deferred to ED physician.

## 2021-10-09 NOTE — Progress Notes (Addendum)
Patient is requesting another Nurse.  Wanted the Press photographer.  Informed Patient that Clinical research associate was the Press photographer.  "I want someone else.  Nobody is listening to me."  "What do you want?" "I want another Nurse."  [facility] informed of request and that Patient has refused Tylenol and Advil for pain and fever.  Red MEWS.  Zofran has been ineffective for nausea and Writer obtained order from [facility] for Reglan IV, Which she is also refusing.   [facility] [employer] came to Writer to report difficulty obtaining blood sample, and was present for interactions to verify care offered to Patient and of refusal.  Reported that patient said "he was rude", and that she did not observe anything rude.  Encouraged Tasha to also speak to [facility] of interactions in room. 22:20 Patient requested heavy blanket, juice and ice pop.  Denied nausea.  Dry cough observed.  Instructed again on importance of Incentive Spirometer use and drinking water.  Accepted Tylenol and Mucinex.       22:35 AC able to talk with Patient. Who reported to him that she felt like nobody has been listening to her since 8 AM.  Writer had informed patient and AC that at time of request for ice pop, Patient had hypoactive BS and severe nausea, and Writer obtained order for Reglan IV.  That Nurse was prioritizing care for nausea so other meds could be given.   AC reports informing Patient that she is receiving safe and competent care and he is not changing Nurse assignments.  AC informed Writer he would e-mail Corrie Dandy (Water quality scientist) for F/U.

## 2021-10-09 NOTE — ED Notes (Signed)
Patient had incontinent episode in bed. New linen placed with brief.

## 2021-10-09 NOTE — ED Notes (Signed)
Attempted to obtain urine sample. Patient states she is unable to obtain one at this time

## 2021-10-09 NOTE — Progress Notes (Signed)
Pharmacy Antibiotic Note  Tracey Dunn is a 26 y.o. female admitted on 10/09/2021 with bilateral lower lobe PNA, sepsis.  Prior to admit, since 6/5 she has had 4 days of Augmentin and 8 days of Levaquin.  Today, Pharmacy has been consulted for cefepime and vancomycin dosing.  Plan: Cefepime 2 g IV every 8 hours Vancomycin 2 g IV x 1 loading dose then vancomcyin 1250 mg IV every 12 hours (Goal AUC 400-550, eAUC 546.2, Scr used; 0.88) Monitor clinical progress, renal function, vancomycin levels as indicated F/U MRSA PCR, C&S, abx deescalation / LOT   Height: 5\' 3"  (160 cm) Weight: 127 kg (280 lb) IBW/kg (Calculated) : 52.4  Temp (24hrs), Avg:101.1 F (38.4 C), Min:99 F (37.2 C), Max:103 F (39.4 C)  Recent Labs  Lab 10/09/21 0903  WBC 6.5  CREATININE 0.88    Estimated Creatinine Clearance: 127.9 mL/min (by C-G formula based on SCr of 0.88 mg/dL).    Allergies  Allergen Reactions   Elemental Sulfur Hives and Other (See Comments)    Patient said she is allergic to "sulfur AND sulfa"   Sulfa Antibiotics Hives    Antimicrobials this admission: 6/22 cefepime >>  6/22 vancomycin >>   Microbiology results: 6/22 MRSA PCR: collected   Thank you for allowing pharmacy to be a part of this patient's care.  7/22, PharmD, BCPS 10/09/2021 6:50 PM

## 2021-10-09 NOTE — ED Notes (Signed)
   10/09/21 1259 10/09/21 1300  Oxygen Therapy  SpO2 (!) 86 % (!) 88 %  O2 Device Room Air Room Air    MD and RT made aware of room air trial

## 2021-10-09 NOTE — ED Triage Notes (Signed)
Patient c/o SOB, fever, cough, chills for about 2 weeks. Went to PCP 2 weeks ago and dx with asthma

## 2021-10-09 NOTE — ED Notes (Signed)
Patient in Radiology. 

## 2021-10-09 NOTE — ED Notes (Signed)
Report sent to receiving nurse.

## 2021-10-09 NOTE — ED Notes (Signed)
In to round on client, pt crying, shivering, states she is so cold she has a HA, oral temp assessed found to be 42F, warm blanket and comfort measures provided, attempted to calm client and reassured.

## 2021-10-10 ENCOUNTER — Observation Stay (HOSPITAL_COMMUNITY): Payer: Medicaid Other

## 2021-10-10 DIAGNOSIS — Z882 Allergy status to sulfonamides status: Secondary | ICD-10-CM | POA: Diagnosis not present

## 2021-10-10 DIAGNOSIS — R0600 Dyspnea, unspecified: Secondary | ICD-10-CM | POA: Diagnosis not present

## 2021-10-10 DIAGNOSIS — I517 Cardiomegaly: Secondary | ICD-10-CM

## 2021-10-10 DIAGNOSIS — A419 Sepsis, unspecified organism: Secondary | ICD-10-CM | POA: Diagnosis present

## 2021-10-10 DIAGNOSIS — Z833 Family history of diabetes mellitus: Secondary | ICD-10-CM | POA: Diagnosis not present

## 2021-10-10 DIAGNOSIS — J9601 Acute respiratory failure with hypoxia: Secondary | ICD-10-CM | POA: Diagnosis present

## 2021-10-10 DIAGNOSIS — D509 Iron deficiency anemia, unspecified: Secondary | ICD-10-CM

## 2021-10-10 DIAGNOSIS — R0602 Shortness of breath: Secondary | ICD-10-CM | POA: Diagnosis present

## 2021-10-10 DIAGNOSIS — J189 Pneumonia, unspecified organism: Secondary | ICD-10-CM | POA: Diagnosis present

## 2021-10-10 DIAGNOSIS — J45909 Unspecified asthma, uncomplicated: Secondary | ICD-10-CM | POA: Diagnosis present

## 2021-10-10 DIAGNOSIS — Z8249 Family history of ischemic heart disease and other diseases of the circulatory system: Secondary | ICD-10-CM | POA: Diagnosis not present

## 2021-10-10 DIAGNOSIS — Z7951 Long term (current) use of inhaled steroids: Secondary | ICD-10-CM | POA: Diagnosis not present

## 2021-10-10 DIAGNOSIS — E876 Hypokalemia: Secondary | ICD-10-CM | POA: Diagnosis present

## 2021-10-10 DIAGNOSIS — J329 Chronic sinusitis, unspecified: Secondary | ICD-10-CM | POA: Diagnosis present

## 2021-10-10 DIAGNOSIS — Z6841 Body Mass Index (BMI) 40.0 and over, adult: Secondary | ICD-10-CM | POA: Diagnosis not present

## 2021-10-10 DIAGNOSIS — Z20822 Contact with and (suspected) exposure to covid-19: Secondary | ICD-10-CM | POA: Diagnosis present

## 2021-10-10 DIAGNOSIS — Z79899 Other long term (current) drug therapy: Secondary | ICD-10-CM | POA: Diagnosis not present

## 2021-10-10 LAB — ECHOCARDIOGRAM COMPLETE
AR max vel: 2.48 cm2
AV Peak grad: 6.3 mmHg
Ao pk vel: 1.25 m/s
Area-P 1/2: 4.49 cm2
Calc EF: 57.8 %
Height: 63 in
S' Lateral: 3.2 cm
Single Plane A2C EF: 58.4 %
Single Plane A4C EF: 57.3 %
Weight: 4480 oz

## 2021-10-10 LAB — COMPREHENSIVE METABOLIC PANEL
ALT: 28 U/L (ref 0–44)
AST: 50 U/L — ABNORMAL HIGH (ref 15–41)
Albumin: 2.8 g/dL — ABNORMAL LOW (ref 3.5–5.0)
Alkaline Phosphatase: 62 U/L (ref 38–126)
Anion gap: 8 (ref 5–15)
BUN: 8 mg/dL (ref 6–20)
CO2: 22 mmol/L (ref 22–32)
Calcium: 8.1 mg/dL — ABNORMAL LOW (ref 8.9–10.3)
Chloride: 110 mmol/L (ref 98–111)
Creatinine, Ser: 0.7 mg/dL (ref 0.44–1.00)
GFR, Estimated: >60 mL/min (ref >60)
Glucose, Bld: 105 mg/dL — ABNORMAL HIGH (ref 70–99)
Potassium: 3.6 mmol/L (ref 3.5–5.1)
Sodium: 140 mmol/L (ref 135–145)
Total Bilirubin: 1.1 mg/dL (ref 0.3–1.2)
Total Protein: 6.3 g/dL — ABNORMAL LOW (ref 6.5–8.1)

## 2021-10-10 LAB — BRAIN NATRIURETIC PEPTIDE: B Natriuretic Peptide: 27.5 pg/mL (ref 0.0–100.0)

## 2021-10-10 LAB — CBC
HCT: 37.4 % (ref 36.0–46.0)
Hemoglobin: 11 g/dL — ABNORMAL LOW (ref 12.0–15.0)
MCH: 21.2 pg — ABNORMAL LOW (ref 26.0–34.0)
MCHC: 29.4 g/dL — ABNORMAL LOW (ref 30.0–36.0)
MCV: 72.1 fL — ABNORMAL LOW (ref 80.0–100.0)
Platelets: 203 10*3/uL (ref 150–400)
RBC: 5.19 MIL/uL — ABNORMAL HIGH (ref 3.87–5.11)
RDW: 19.1 % — ABNORMAL HIGH (ref 11.5–15.5)
WBC: 9.9 10*3/uL (ref 4.0–10.5)
nRBC: 0 % (ref 0.0–0.2)

## 2021-10-10 LAB — IRON AND TIBC
Iron: 19 ug/dL — ABNORMAL LOW (ref 28–170)
Saturation Ratios: 6 % — ABNORMAL LOW (ref 10.4–31.8)
TIBC: 328 ug/dL (ref 250–450)
UIBC: 309 ug/dL

## 2021-10-10 LAB — HIV ANTIBODY (ROUTINE TESTING W REFLEX): HIV Screen 4th Generation wRfx: NONREACTIVE

## 2021-10-10 LAB — PROTIME-INR
INR: 1.5 — ABNORMAL HIGH (ref 0.8–1.2)
Prothrombin Time: 18 seconds — ABNORMAL HIGH (ref 11.4–15.2)

## 2021-10-10 LAB — LACTIC ACID, PLASMA: Lactic Acid, Venous: 1.5 mmol/L (ref 0.5–1.9)

## 2021-10-10 LAB — CORTISOL-AM, BLOOD: Cortisol - AM: 14.6 ug/dL (ref 6.7–22.6)

## 2021-10-10 LAB — PROCALCITONIN: Procalcitonin: 0.41 ng/mL

## 2021-10-10 MED ORDER — IPRATROPIUM-ALBUTEROL 0.5-2.5 (3) MG/3ML IN SOLN
3.0000 mL | Freq: Four times a day (QID) | RESPIRATORY_TRACT | Status: DC
  Administered 2021-10-10 (×2): 3 mL via RESPIRATORY_TRACT
  Filled 2021-10-10 (×2): qty 3

## 2021-10-10 MED ORDER — FUROSEMIDE 10 MG/ML IJ SOLN
40.0000 mg | Freq: Once | INTRAMUSCULAR | Status: AC
  Administered 2021-10-10: 40 mg via INTRAVENOUS
  Filled 2021-10-10: qty 4

## 2021-10-10 MED ORDER — IPRATROPIUM-ALBUTEROL 0.5-2.5 (3) MG/3ML IN SOLN
3.0000 mL | Freq: Three times a day (TID) | RESPIRATORY_TRACT | Status: DC
  Administered 2021-10-11 – 2021-10-12 (×4): 3 mL via RESPIRATORY_TRACT
  Filled 2021-10-10 (×4): qty 3

## 2021-10-10 MED ORDER — SODIUM CHLORIDE 0.9 % IV SOLN: INTRAVENOUS | Status: DC | PRN

## 2021-10-10 MED ORDER — METHYLPREDNISOLONE SODIUM SUCC 125 MG IJ SOLR
60.0000 mg | Freq: Two times a day (BID) | INTRAMUSCULAR | Status: DC
  Administered 2021-10-10 – 2021-10-14 (×8): 60 mg via INTRAVENOUS
  Filled 2021-10-10 (×8): qty 2

## 2021-10-10 MED ORDER — FERROUS GLUCONATE 324 (38 FE) MG PO TABS
324.0000 mg | ORAL_TABLET | Freq: Every day | ORAL | Status: DC
  Administered 2021-10-11 – 2021-10-14 (×4): 324 mg via ORAL
  Filled 2021-10-10 (×4): qty 1

## 2021-10-10 MED ORDER — METOCLOPRAMIDE HCL 5 MG/ML IJ SOLN
INTRAMUSCULAR | Status: AC
  Filled 2021-10-10: qty 2

## 2021-10-11 DIAGNOSIS — J189 Pneumonia, unspecified organism: Secondary | ICD-10-CM | POA: Diagnosis not present

## 2021-10-11 DIAGNOSIS — A419 Sepsis, unspecified organism: Secondary | ICD-10-CM | POA: Diagnosis not present

## 2021-10-11 LAB — BASIC METABOLIC PANEL
Anion gap: 10 (ref 5–15)
BUN: 10 mg/dL (ref 6–20)
CO2: 24 mmol/L (ref 22–32)
Calcium: 8.9 mg/dL (ref 8.9–10.3)
Chloride: 106 mmol/L (ref 98–111)
Creatinine, Ser: 0.74 mg/dL (ref 0.44–1.00)
GFR, Estimated: >60 mL/min (ref >60)
Glucose, Bld: 128 mg/dL — ABNORMAL HIGH (ref 70–99)
Potassium: 4.4 mmol/L (ref 3.5–5.1)
Sodium: 140 mmol/L (ref 135–145)

## 2021-10-11 MED ORDER — STERILE WATER FOR INJECTION IJ SOLN
INTRAMUSCULAR | Status: AC
  Filled 2021-10-11: qty 10

## 2021-10-11 MED ORDER — FUROSEMIDE 10 MG/ML IJ SOLN
40.0000 mg | Freq: Once | INTRAMUSCULAR | Status: AC
  Administered 2021-10-11: 40 mg via INTRAVENOUS
  Filled 2021-10-11: qty 4

## 2021-10-12 DIAGNOSIS — A419 Sepsis, unspecified organism: Secondary | ICD-10-CM | POA: Diagnosis not present

## 2021-10-12 DIAGNOSIS — J189 Pneumonia, unspecified organism: Secondary | ICD-10-CM | POA: Diagnosis not present

## 2021-10-12 LAB — RESPIRATORY PANEL BY PCR

## 2021-10-12 LAB — BASIC METABOLIC PANEL
Anion gap: 8 (ref 5–15)
BUN: 15 mg/dL (ref 6–20)
CO2: 25 mmol/L (ref 22–32)
Calcium: 9.7 mg/dL (ref 8.9–10.3)
Chloride: 109 mmol/L (ref 98–111)
Creatinine, Ser: 0.78 mg/dL (ref 0.44–1.00)
GFR, Estimated: >60 mL/min (ref >60)
Glucose, Bld: 135 mg/dL — ABNORMAL HIGH (ref 70–99)
Potassium: 5 mmol/L (ref 3.5–5.1)
Sodium: 142 mmol/L (ref 135–145)

## 2021-10-12 LAB — PROCALCITONIN: Procalcitonin: 0.13 ng/mL

## 2021-10-12 LAB — MAGNESIUM: Magnesium: 2.6 mg/dL — ABNORMAL HIGH (ref 1.7–2.4)

## 2021-10-12 MED ORDER — FUROSEMIDE 10 MG/ML IJ SOLN: 40.0000 mg | Freq: Every day | INTRAMUSCULAR | Status: DC

## 2021-10-12 MED ORDER — IPRATROPIUM-ALBUTEROL 0.5-2.5 (3) MG/3ML IN SOLN
3.0000 mL | Freq: Two times a day (BID) | RESPIRATORY_TRACT | Status: DC
  Administered 2021-10-12 – 2021-10-14 (×4): 3 mL via RESPIRATORY_TRACT
  Filled 2021-10-12 (×4): qty 3

## 2021-10-12 MED ORDER — STERILE WATER FOR INJECTION IJ SOLN
INTRAMUSCULAR | Status: AC
  Filled 2021-10-12: qty 10

## 2021-10-12 MED ORDER — FUROSEMIDE 10 MG/ML IJ SOLN
40.0000 mg | Freq: Once | INTRAMUSCULAR | Status: AC
  Administered 2021-10-12: 40 mg via INTRAVENOUS
  Filled 2021-10-12: qty 4

## 2021-10-12 MED ORDER — SENNOSIDES-DOCUSATE SODIUM 8.6-50 MG PO TABS
1.0000 | ORAL_TABLET | Freq: Two times a day (BID) | ORAL | Status: DC
  Administered 2021-10-13 – 2021-10-14 (×3): 1 via ORAL
  Filled 2021-10-12 (×4): qty 1

## 2021-10-12 NOTE — Progress Notes (Addendum)
PROGRESS NOTE    Tracey Dunn  QMV:[id number] DOB: 04/10/1996 DOA: 10/09/2021 PCP: Heron Nay, PA     Brief Narrative:  26 year old female with medical history of morbid obesity, anemia, asthma, presented with cough and shortness of breath.  Patient said that she had flare of productive cough 2.5 weeks ago.  She had televisit with her PCP on 09/22/2021 at that time she was started on Augmentin and inhalers.  Her symptoms did not improve.  On 6/9 seizure her PCP again at that time antibiotics were changed to Levaquin for 10-day course.  Patient completed 8-day course of Levaquin.  Patient did not improve also having intermittent fever with nausea and vomiting.   cT chest personally reviewed "There are moderate patchy ground-glass opacities with air bronchogram seen in both lungs and have increased in the interim. Confluent opacities at the lung bases greater on the right. Findings likely on the basis of pulmonary edema. Superimposed infiltrations cannot be entirely excluded. Minor atelectatic changes at the right middle and left lower lobe."   Subjective:   On 2-3liter oxygen , Reports feeling better, think lasix helped Less cough Denies chest pain Fever has resolved  Assessment & Plan:  Principal Problem:   Sepsis due to pneumonia [facility]) Active Problems:   Microcytic anemia   Obesity, Class III, BMI 40-49.9 (morbid obesity) (HCC)   Hypokalemia   Dyspnea    Acute hypoxic respiratory failure/PNA+dCHF? -resents with fever, tachycardia , sepsis picture , failed outpatient abx treatment x2weeks -Ct with contrast no obvious PE, respiratory viral panel negative , procalcitonin 0.4- 0.1, urine strep pneumo negative, urine Legionella antigen in process, HIV negative --Component of acute diastolic CHF?, reports feeling better with IV Lasix,  -start to improve, lung sounds less tight, start to hear air movement with some wheezing on 6/25 -continue iv lasix, steroids, abx, repeat cxr  two view on 6/26 am (ordered ), wean oxygen -can go home if continue improve, but May need pulmonary consult if not able to wean off oxygen as she has been treated for PNA since earlier this month  Hypokalemia Replaced, improved   Microcytic anemia Iron saturation 6% Started on iron supplement  Report sinusitis since November last year, report had PFT test but test result was not reliable due to having sinusitis at the time.   Advise outpatient ENT follow up, has tenderness to frontal sinus and maxillary sinus region    Body mass index is 49.6 kg/m.Marland Kitchen  Meet criteria for class III obesity       I have Reviewed nursing notes, Vitals, pain scores, I/o's, Lab results and  imaging results since pt's last encounter, details please see discussion above  I ordered the following labs:  Unresulted Labs (From admission, onward)     Start     Ordered   10/16/21 0500  Creatinine, serum  (enoxaparin (LOVENOX)    CrCl >/= 30 ml/min)  Weekly,   R     Comments: while on enoxaparin therapy    10/09/21 1725   10/13/21 0500  Basic metabolic panel  Tomorrow morning,   R        10/12/21 1841   10/12/21 1838  Expectorated Sputum Assessment w Gram Stain, Rflx to Resp Cult  Once,   R        10/12/21 1838   10/12/21 0500  Procalcitonin  Daily,   R      10/11/21 2153   10/09/21 1922  Legionella Pneumophila Serogp 1 Ur Ag  Once,  R        10/09/21 1922             DVT prophylaxis:   Lovenox   Code Status:   Code Status: Full Code  Family Communication: Patient Disposition:   Dispo: The patient is from: Home              Anticipated d/c is to: Home              Anticipated d/c date is: possible Monday or Tuesday if she continue to improve and able to wean off oxygen, otherwise, may need inpatient pulmonology consult  F/u on cxr two view, ordered to be done on 6/26 am  Antimicrobials:   Anti-infectives (From admission, onward)    Start     Dose/Rate Route Frequency Ordered Stop    10/10/21 0800  vancomycin (VANCOREADY) IVPB 1250 mg/250 mL  Status:  Discontinued        1,250 mg 166.7 mL/hr over 90 Minutes Intravenous Every 12 hours 10/09/21 1850 10/10/21 0942   10/09/21 1830  vancomycin (VANCOREADY) IVPB 2000 mg/400 mL        2,000 mg 200 mL/hr over 120 Minutes Intravenous  Once 10/09/21 1741 10/09/21 2051   10/09/21 1815  vancomycin (VANCOCIN) IVPB 1000 mg/200 mL premix  Status:  Discontinued        1,000 mg 200 mL/hr over 60 Minutes Intravenous  Once 10/09/21 1725 10/09/21 1728   10/09/21 1815  ceFEPIme (MAXIPIME) 2 g in sodium chloride 0.9 % 100 mL IVPB  Status:  Discontinued        2 g 200 mL/hr over 30 Minutes Intravenous  Once 10/09/21 1725 10/09/21 1729   10/09/21 1800  ceFEPIme (MAXIPIME) 2 g in sodium chloride 0.9 % 100 mL IVPB        2 g 200 mL/hr over 30 Minutes Intravenous Every 8 hours 10/09/21 1739 10/16/21 2159   10/09/21 1230  cefTRIAXone (ROCEPHIN) 1 g in sodium chloride 0.9 % 100 mL IVPB        1 g 200 mL/hr over 30 Minutes Intravenous  Once 10/09/21 1223 10/09/21 1340   10/09/21 1230  azithromycin (ZITHROMAX) tablet 500 mg        500 mg Oral  Once 10/09/21 1223 10/09/21 1233           Objective: Vitals:   10/11/21 2135 10/12/21 0522 10/12/21 1027 10/12/21 1038  BP:  119/72    Pulse:  (!) 54    Resp:  16    Temp:  97.8 F (36.6 C)    TempSrc:  Oral    SpO2: 92% 98% (!) 87% 96%  Weight:      Height:        Intake/Output Summary (Last 24 hours) at 10/12/2021 1841 Last data filed at 10/12/2021 1400 Gross per 24 hour  Intake 649.11 ml  Output 700 ml  Net -50.89 ml   Filed Weights   10/09/21 0840  Weight: 127 kg    Examination:  General exam: alert, awake, communicative,calm, NAD Respiratory system:  improved air movement, some scattered wheezing, respiratory effort normal. Cardiovascular system:  RRR.  Gastrointestinal system: Abdomen is nondistended, soft and nontender.  Normal bowel sounds heard. Central nervous system:  Alert and oriented. No focal neurological deficits. Extremities:  no edema Skin: No rashes, lesions or ulcers Psychiatry: Judgement and insight appear normal. Mood & affect appropriate.     Data Reviewed: I have personally reviewed  labs and visualized  imaging  studies since the last encounter and formulate the plan        Scheduled Meds:  enoxaparin (LOVENOX) injection  60 mg Subcutaneous Q24H   ferrous gluconate  324 mg Oral Q breakfast   [START ON 10/13/2021] furosemide  40 mg Intravenous Daily   guaiFENesin  600 mg Oral BID   ipratropium-albuterol  3 mL Nebulization BID   methylPREDNISolone (SOLU-MEDROL) injection  60 mg Intravenous BID   senna-docusate  1 tablet Oral BID   Continuous Infusions:  sodium chloride 10 mL/hr at 10/10/21 1341   ceFEPime (MAXIPIME) IV 2 g (10/12/21 1336)     LOS: 2 days    Albertine Grates, MD PhD FACP [facility]  Available via Epic secure chat 7am-7pm for nonurgent issues Please page for urgent issues To page the attending provider between 7A-7P or the covering provider during after hours 7P-7A, please log into the web site [email or web address] and access using universal [place] password for that web site. If you do not have the password, please call the hospital operator.    10/12/2021, 6:41 PM

## 2021-10-13 ENCOUNTER — Inpatient Hospital Stay (HOSPITAL_COMMUNITY): Payer: Medicaid Other

## 2021-10-13 DIAGNOSIS — J9601 Acute respiratory failure with hypoxia: Secondary | ICD-10-CM | POA: Diagnosis not present

## 2021-10-13 DIAGNOSIS — J189 Pneumonia, unspecified organism: Secondary | ICD-10-CM | POA: Diagnosis not present

## 2021-10-13 DIAGNOSIS — E876 Hypokalemia: Secondary | ICD-10-CM | POA: Diagnosis not present

## 2021-10-13 LAB — BASIC METABOLIC PANEL
Anion gap: 7 (ref 5–15)
BUN: 16 mg/dL (ref 6–20)
CO2: 26 mmol/L (ref 22–32)
Calcium: 8.9 mg/dL (ref 8.9–10.3)
Chloride: 106 mmol/L (ref 98–111)
Creatinine, Ser: 0.54 mg/dL (ref 0.44–1.00)
GFR, Estimated: >60 mL/min (ref >60)
Glucose, Bld: 141 mg/dL — ABNORMAL HIGH (ref 70–99)
Potassium: 4.3 mmol/L (ref 3.5–5.1)
Sodium: 139 mmol/L (ref 135–145)

## 2021-10-13 LAB — LEGIONELLA PNEUMOPHILA SEROGP 1 UR AG: L. pneumophila Serogp 1 Ur Ag: NEGATIVE

## 2021-10-13 LAB — PROCALCITONIN: Procalcitonin: <0.1 ng/mL

## 2021-10-13 LAB — EXPECTORATED SPUTUM ASSESSMENT W GRAM STAIN, RFLX TO RESP C

## 2021-10-13 MED ORDER — FUROSEMIDE 10 MG/ML IJ SOLN
40.0000 mg | Freq: Once | INTRAMUSCULAR | Status: AC
  Administered 2021-10-13: 40 mg via INTRAVENOUS
  Filled 2021-10-13: qty 4

## 2021-10-13 MED ORDER — ORAL CARE MOUTH RINSE: 15.0000 mL | OROMUCOSAL | Status: DC | PRN

## 2021-10-13 MED ORDER — POTASSIUM CHLORIDE CRYS ER 20 MEQ PO TBCR
40.0000 meq | EXTENDED_RELEASE_TABLET | Freq: Once | ORAL | Status: AC
  Administered 2021-10-13: 40 meq via ORAL
  Filled 2021-10-13: qty 2

## 2021-10-13 MED ORDER — FUROSEMIDE 10 MG/ML IJ SOLN: 40.0000 mg | Freq: Two times a day (BID) | INTRAMUSCULAR | Status: DC

## 2021-10-13 MED ORDER — POTASSIUM CHLORIDE CRYS ER 20 MEQ PO TBCR: 40.0000 meq | EXTENDED_RELEASE_TABLET | Freq: Two times a day (BID) | ORAL | Status: DC

## 2021-10-14 DIAGNOSIS — J189 Pneumonia, unspecified organism: Secondary | ICD-10-CM | POA: Diagnosis not present

## 2021-10-14 DIAGNOSIS — E876 Hypokalemia: Secondary | ICD-10-CM | POA: Diagnosis not present

## 2021-10-14 DIAGNOSIS — D509 Iron deficiency anemia, unspecified: Secondary | ICD-10-CM

## 2021-10-14 DIAGNOSIS — J9601 Acute respiratory failure with hypoxia: Secondary | ICD-10-CM | POA: Diagnosis not present

## 2021-10-14 LAB — BASIC METABOLIC PANEL
Anion gap: 10 (ref 5–15)
BUN: 21 mg/dL — ABNORMAL HIGH (ref 6–20)
CO2: 26 mmol/L (ref 22–32)
Calcium: 9.2 mg/dL (ref 8.9–10.3)
Chloride: 105 mmol/L (ref 98–111)
Creatinine, Ser: 0.78 mg/dL (ref 0.44–1.00)
GFR, Estimated: >60 mL/min (ref >60)
Glucose, Bld: 149 mg/dL — ABNORMAL HIGH (ref 70–99)
Potassium: 4.6 mmol/L (ref 3.5–5.1)
Sodium: 141 mmol/L (ref 135–145)

## 2021-10-14 MED ORDER — PREDNISONE 10 MG PO TABS: ORAL_TABLET | ORAL | 0 refills | Status: AC

## 2021-10-14 MED ORDER — AMOXICILLIN-POT CLAVULANATE 875-125 MG PO TABS: 1.0000 | ORAL_TABLET | Freq: Two times a day (BID) | ORAL | 0 refills | Status: AC

## 2021-10-14 NOTE — Plan of Care (Signed)
Patient discharging home via private vehicle. Awaiting mother for clothing and ride. Haydee Salter, RN 10/14/21 10:08 AM

## 2021-10-15 LAB — CULTURE, RESPIRATORY W GRAM STAIN
Culture: NORMAL
Gram Stain: NONE SEEN

## 2022-02-07 ENCOUNTER — Emergency Department (HOSPITAL_BASED_OUTPATIENT_CLINIC_OR_DEPARTMENT_OTHER)
Admission: EM | Admit: 2022-02-07 | Discharge: 2022-02-07 | Disposition: A | Discharge status: Home / Self Care | Payer: Medicaid Other | Attending: Emergency Medicine | Admitting: Emergency Medicine

## 2022-02-07 ENCOUNTER — Other Ambulatory Visit: Payer: Self-pay

## 2022-02-07 ENCOUNTER — Emergency Department (HOSPITAL_BASED_OUTPATIENT_CLINIC_OR_DEPARTMENT_OTHER): Payer: Medicaid Other

## 2022-02-07 DIAGNOSIS — M5432 Sciatica, left side: Secondary | ICD-10-CM | POA: Insufficient documentation

## 2022-02-07 DIAGNOSIS — M79605 Pain in left leg: Secondary | ICD-10-CM | POA: Diagnosis present

## 2022-02-07 MED ORDER — ACETAMINOPHEN 500 MG PO TABS
1000.0000 mg | ORAL_TABLET | Freq: Once | ORAL | Status: AC
  Administered 2022-02-07: 1000 mg via ORAL
  Filled 2022-02-07: qty 2

## 2022-02-07 MED ORDER — IBUPROFEN 400 MG PO TABS
600.0000 mg | ORAL_TABLET | Freq: Once | ORAL | Status: AC
  Administered 2022-02-07: 600 mg via ORAL
  Filled 2022-02-07: qty 1

## 2022-02-07 MED ORDER — LIDOCAINE 5 % EX PTCH
1.0000 | MEDICATED_PATCH | Freq: Once | CUTANEOUS | Status: DC
  Administered 2022-02-07: 1 via TRANSDERMAL
  Filled 2022-02-07: qty 3

## 2022-02-07 NOTE — ED Triage Notes (Signed)
C/O left lower medial part of leg; noticed upon waking; - swelling; - redness; reported hx of sciatica. Stated pain is worst with walking.

## 2022-02-07 NOTE — Discharge Instructions (Addendum)
You were seen in the emergency department for your leg pain.  This is consistent with sciatica.  You no evidence of blood clots.  You can take Tylenol and Motrin as needed for pain.  Both can be taken up to every 6 hours.  You can also try the lidocaine patches which can help numb the skin and the muscles around your leg pain can help with the pain.  You should follow-up with your primary doctor in the next few days to have your symptoms rechecked and if you are continuing to have pain may benefit from physical therapy.  You should return to the emergency department if you are having numbness or weakness in this leg, numbness of your groin, you are unable to walk, you are unable to urinate or if you have any other new or concerning symptoms.

## 2022-02-07 NOTE — ED Provider Notes (Signed)
[facility] EMERGENCY DEPARTMENT Provider Note   CSN: [id number] Arrival date & time: 02/07/22  1813     History {Add pertinent medical, surgical, social history, OB history to HPI:1} Chief Complaint  Patient presents with   Leg Pain    Tracey Dunn is a 26 y.o. female.  Patient is a 26 year old female with a past medical history of sciatica presenting to the emergency department with left leg pain.  Patient states that she woke up with pain in her left leg.  She states it is in the back of her knee and radiates down to her foot.  She states that she also is having some low back pain.  She denies any trauma or falls, recent heavy lifting or changes in activity.  She states that she took Tylenol for her pain with minimal relief.  She states that it feels similar to her prior sciatica but is more severe.  She denies any lower extremity swelling.  She denies any numbness or weakness, saddle anesthesia, loss of bowel or bladder function, dysuria or hematuria, fevers or chills.  The history is provided by the patient.  Leg Pain Patient is a 26 year old     Home Medications Prior to Admission medications   Medication Sig Start Date End Date Taking? Authorizing Provider  acetaminophen (TYLENOL) 500 MG tablet Take 500-1,000 mg by mouth every 6 (six) hours as needed for mild pain or headache.    [provider]  fluticasone (FLONASE) 50 MCG/ACT nasal spray Place 2 sprays into both nostrils daily as needed for allergies or rhinitis.    [provider]  levocetirizine (XYZAL) 5 MG tablet Take 5 mg by mouth daily as needed for allergies.    [provider]  methocarbamol (ROBAXIN) 500 MG tablet Take 1 tablet (500 mg total) by mouth every 8 (eight) hours as needed for muscle spasms. Patient not taking: Reported on 10/09/2021 07/07/21   Alvira Monday, MD  Norethindrone Acetate-Ethinyl Estrad-FE (HAILEY 24 FE) 1-20 MG-MCG(24) tablet TAKE 1 TABLET BY MOUTH  DAILY Patient not taking: Reported on 10/09/2021 08/08/21   Levie Heritage, DO  pantoprazole (PROTONIX) 40 MG tablet Take 1 tablet (40 mg total) by mouth daily for 14 days. Patient not taking: Reported on 10/09/2021 07/07/21 10/09/21  Alvira Monday, MD  predniSONE (DELTASONE) 10 MG tablet Takes 6 tablets for 1 days, then 5 tablets for 1 days, then 4 tablets for 1 days, then 3 tablets for 1 days, then 2 tabs for 1 days, then 1 tab for 1 days, and then stop. 10/14/21   Marinda Elk, MD  sertraline (ZOLOFT) 25 MG tablet Take 25 mg by mouth daily.    [provider]  VENTOLIN HFA 108 (90 Base) MCG/ACT inhaler Inhale 2 puffs into the lungs every 6 (six) hours as needed for wheezing or shortness of breath.    [provider]  cetirizine (ZYRTEC) 10 MG tablet Take 1 tablet (10 mg total) by mouth daily. Patient not taking: Reported on 07/06/2019 02/13/19 11/14/19  Levie Heritage, DO  ferrous sulfate 325 (65 FE) MG tablet Take 1 tablet (325 mg total) by mouth every other day. Patient not taking: Reported on 07/06/2019 06/06/19 11/14/19  Joselyn Arrow, MD      Allergies    Elemental sulfur and Sulfa antibiotics    Review of Systems   Review of Systems  Physical Exam Updated Vital Signs BP 121/66 (BP Location: Right Arm)   Pulse (!) 115  Temp 98.5 F (36.9 C) (Oral)   Resp 20   Ht 5\' 3"  (1.6 m)   Wt 120.2 kg   LMP 01/31/2022   SpO2 95%   BMI 46.94 kg/m  Physical Exam Vitals and nursing note reviewed.  Constitutional:      General: She is not in acute distress.    Appearance: Normal appearance. She is obese.  HENT:     Head: Normocephalic and atraumatic.     Nose: Nose normal.     Mouth/Throat:     Mouth: Mucous membranes are moist.     Pharynx: Oropharynx is clear.  Eyes:     Extraocular Movements: Extraocular movements intact.     Conjunctiva/sclera: Conjunctivae normal.  Neck:     Comments: No midline neck tenderness Cardiovascular:     Rate and  Rhythm: Normal rate and regular rhythm.     Pulses: Normal pulses.     Heart sounds: Normal heart sounds.  Pulmonary:     Effort: Pulmonary effort is normal.     Breath sounds: Normal breath sounds.  Abdominal:     General: Abdomen is flat.     Palpations: Abdomen is soft.     Tenderness: There is no abdominal tenderness.  Musculoskeletal:        General: Normal range of motion.     Cervical back: Normal range of motion and neck supple.     Right lower leg: No edema.     Left lower leg: No edema.     Comments: Bilateral lumbar paraspinal muscle tenderness No midline back tenderness No bony tenderness to bilateral lower extremities Positive straight leg raise on the left and cross straight leg on the right  Skin:    General: Skin is warm and dry.  Neurological:     General: No focal deficit present.     Mental Status: She is alert and oriented to person, place, and time.     Sensory: No sensory deficit.     Motor: No weakness.  Psychiatric:        Mood and Affect: Mood normal.        Behavior: Behavior normal.     ED Results / Procedures / Treatments   Labs (all labs ordered are listed, but only abnormal results are displayed) Labs Reviewed - No data to display  EKG None  Radiology 02/02/2022 Venous Img Lower Unilateral Left  Result Date: 02/07/2022 CLINICAL DATA:  Left lower extremity pain. EXAM: Left LOWER EXTREMITY VENOUS DOPPLER ULTRASOUND TECHNIQUE: Gray-scale sonography with compression, as well as color and duplex ultrasound, were performed to evaluate the deep venous system(s) from the level of the common femoral vein through the popliteal and proximal calf veins. COMPARISON:  February 27, 2019. FINDINGS: VENOUS Normal compressibility of the common femoral, superficial femoral, and popliteal veins, as well as the visualized calf veins. Visualized portions of profunda femoral vein and great saphenous vein unremarkable. No filling defects to suggest DVT on grayscale or color  Doppler imaging. Doppler waveforms show normal direction of venous flow, normal respiratory plasticity and response to augmentation. Limited views of the contralateral common femoral vein are unremarkable. OTHER None. Limitations: none IMPRESSION: Negative. Electronically Signed   By: March 01, 2019 M.D.   On: 02/07/2022 19:34    Procedures Procedures  {Document cardiac monitor, telemetry assessment procedure when appropriate:1}  Medications Ordered in ED Medications  acetaminophen (TYLENOL) tablet 1,000 mg (has no administration in time range)  ibuprofen (ADVIL) tablet 600 mg (has no administration  in time range)  lidocaine (LIDODERM) 5 % 1-3 patch (has no administration in time range)    ED Course/ Medical Decision Making/ A&P Clinical Course as of 02/07/22 2205  Sat Feb 07, 2022  2205 Patient was initially tachycardic in triage but upon my auscultation and palpation heart rate is in normal range [VK]    Clinical Course User Index [VK] Kemper Durie, DO                           Medical Decision Making This patient presents to the ED with chief complaint(s) of left leg pain with pertinent past medical history of sciatica which further complicates the presenting complaint. The complaint involves an extensive differential diagnosis and also carries with it a high risk of complications and morbidity.    The differential diagnosis includes sciatic nerve pain, no evidence of severe nerve compression, no midline back tenderness, no saddle anesthesia and no focal neurologic deficits making cauda equina unlikely, no urinary symptoms making pyelonephritis or nephrolithiasis unlikely, no leg swelling making DVT less likely, no trauma or falls making fracture dislocation unlikely  Additional history obtained: Additional history obtained from N/A Records reviewed N/A  ED Course and Reassessment: Patient was initially evaluated in triage and had a DVT study of the left lower leg  performed that was negative for DVT.  She does have positive straight leg raise on my exam consistent with sciatica.  She has no focal neurologic deficits.  She will be treated with Tylenol, Motrin and lidocaine patch and recommended primary care follow-up.  She was given strict return precautions.  Independent labs interpretation:  N/A  Independent visualization of imaging: - I independently visualized the following imaging with scope of interpretation limited to determining acute life threatening conditions related to emergency care: Left lower extremity DVT  US, which revealed evidence of DVT  Consultation: - Consulted or discussed management/test interpretation w/ external professional: N/A  Consideration for admission or further workup: Patient has no emergent condition requiring admission or further work-up at this time and is stable for discharge with primary care follow-up Social Determinants of health: N/A    Risk OTC drugs. Prescription drug management.    {Document critical care time when appropriate:1} {Document review of labs and clinical decision tools ie heart score, Chads2Vasc2 etc:1}  {Document your independent review of radiology images, and any outside records:1} {Document your discussion with family members, caretakers, and with consultants:1} {Document social determinants of health affecting pt's care:1} {Document your decision making why or why not admission, treatments were needed:1} Final Clinical Impression(s) / ED Diagnoses Final diagnoses:  None    Rx / DC Orders ED Discharge Orders     None

## 2022-02-07 NOTE — ED Notes (Signed)
D/c paperwork reviewed with pt.  Pt with no questions or concerns at time of d/c. Pt ambulatory to ED exit without assistance, NAD.

## 2023-01-29 ENCOUNTER — Encounter (HOSPITAL_BASED_OUTPATIENT_CLINIC_OR_DEPARTMENT_OTHER): Payer: Self-pay | Admitting: Pediatrics

## 2023-01-29 ENCOUNTER — Other Ambulatory Visit: Payer: Self-pay

## 2023-01-29 ENCOUNTER — Emergency Department (HOSPITAL_BASED_OUTPATIENT_CLINIC_OR_DEPARTMENT_OTHER): Payer: BLUE CROSS/BLUE SHIELD

## 2023-01-29 ENCOUNTER — Emergency Department (HOSPITAL_BASED_OUTPATIENT_CLINIC_OR_DEPARTMENT_OTHER)
Admission: EM | Admit: 2023-01-29 | Discharge: 2023-01-29 | Disposition: A | Discharge status: Home / Self Care | Payer: BLUE CROSS/BLUE SHIELD | Attending: Emergency Medicine | Admitting: Emergency Medicine

## 2023-01-29 DIAGNOSIS — M79671 Pain in right foot: Secondary | ICD-10-CM | POA: Diagnosis not present

## 2023-01-29 DIAGNOSIS — O26891 Other specified pregnancy related conditions, first trimester: Secondary | ICD-10-CM | POA: Insufficient documentation

## 2023-01-29 DIAGNOSIS — Z3A08 8 weeks gestation of pregnancy: Secondary | ICD-10-CM | POA: Diagnosis not present

## 2023-01-29 DIAGNOSIS — W108XXA Fall (on) (from) other stairs and steps, initial encounter: Secondary | ICD-10-CM | POA: Insufficient documentation

## 2023-01-29 DIAGNOSIS — M25571 Pain in right ankle and joints of right foot: Secondary | ICD-10-CM | POA: Diagnosis not present

## 2023-01-29 MED ORDER — ACETAMINOPHEN 325 MG PO TABS
650.0000 mg | ORAL_TABLET | Freq: Once | ORAL | Status: AC
  Administered 2023-01-29: 650 mg via ORAL
  Filled 2023-01-29: qty 2

## 2023-01-29 NOTE — Discharge Instructions (Addendum)
It was a pleasure taking care of you today.  As discussed, your x-rays did not show any broken bones.  Continue to ice and elevate your right leg.  You may take over-the-counter Tylenol as needed for pain.  I have included the number of the orthopedic surgeon.  Please call to schedule an appointment if symptoms do not improve over the next week.  Return to the ER for any worsening symptoms.

## 2023-01-29 NOTE — ED Provider Notes (Signed)
[facility] Provider Note   CSN: [id number] Arrival date & time: 01/29/23  1255     History  Chief Complaint  Patient presents with   Ankle Pain    Tracey Dunn is a 27 y.o. female with a past medical history significant for obesity and anemia who presents to the ED due to right ankle and foot pain after a fall that occurred prior to arrival.  Patient was walking downstairs and slipped and fell down 2-3 steps.  No head injury.  Admits to right ankle and foot pain.  Pain worse with ambulation.  No other injuries.  Patient is currently [redacted] weeks pregnant.  Denies falling on her abdomen.  Denies back pain.  No chest pain or shortness of breath.  Denies vaginal bleeding or fluid from the vagina.  No numbness/tingling.  History obtained from patient and past medical records. No interpreter used during encounter.       Home Medications Prior to Admission medications   Medication Sig Start Date End Date Taking? Authorizing Provider  acetaminophen (TYLENOL) 500 MG tablet Take 500-1,000 mg by mouth every 6 (six) hours as needed for mild pain or headache.    [provider]  fluticasone (FLONASE) 50 MCG/ACT nasal spray Place 2 sprays into both nostrils daily as needed for allergies or rhinitis.    [provider]  levocetirizine (XYZAL) 5 MG tablet Take 5 mg by mouth daily as needed for allergies.    [provider]  methocarbamol (ROBAXIN) 500 MG tablet Take 1 tablet (500 mg total) by mouth every 8 (eight) hours as needed for muscle spasms. Patient not taking: Reported on 10/09/2021 07/07/21   Alvira Monday, MD  Norethindrone Acetate-Ethinyl Estrad-FE (HAILEY 24 FE) 1-20 MG-MCG(24) tablet TAKE 1 TABLET BY MOUTH DAILY Patient not taking: Reported on 10/09/2021 08/08/21   Levie Heritage, DO  pantoprazole (PROTONIX) 40 MG tablet Take 1 tablet (40 mg total) by mouth daily for 14 days. Patient not taking: Reported on  10/09/2021 07/07/21 10/09/21  Alvira Monday, MD  predniSONE (DELTASONE) 10 MG tablet Takes 6 tablets for 1 days, then 5 tablets for 1 days, then 4 tablets for 1 days, then 3 tablets for 1 days, then 2 tabs for 1 days, then 1 tab for 1 days, and then stop. 10/14/21   Marinda Elk, MD  sertraline (ZOLOFT) 25 MG tablet Take 25 mg by mouth daily.    [provider]  VENTOLIN HFA 108 (90 Base) MCG/ACT inhaler Inhale 2 puffs into the lungs every 6 (six) hours as needed for wheezing or shortness of breath.    [provider]  cetirizine (ZYRTEC) 10 MG tablet Take 1 tablet (10 mg total) by mouth daily. Patient not taking: Reported on 07/06/2019 02/13/19 11/14/19  Levie Heritage, DO  ferrous sulfate 325 (65 FE) MG tablet Take 1 tablet (325 mg total) by mouth every other day. Patient not taking: Reported on 07/06/2019 06/06/19 11/14/19  Joselyn Arrow, MD      Allergies    Elemental sulfur and Sulfa antibiotics    Review of Systems   Review of Systems  Respiratory:  Negative for shortness of breath.   Cardiovascular:  Negative for chest pain.  Gastrointestinal:  Negative for abdominal pain.  Musculoskeletal:  Positive for arthralgias. Negative for back pain.    Physical Exam Updated Vital Signs BP 112/72 (BP Location: Left Arm)   Pulse (!) 107   Temp 98.7 F (37.1  C) (Oral)   Resp 18   Ht 5\' 3"  (1.6 m)   Wt 108.9 kg   LMP 12/04/2022   SpO2 100%   BMI 42.51 kg/m  Physical Exam Vitals and nursing note reviewed.  Constitutional:      General: She is not in acute distress.    Appearance: She is not ill-appearing.  HENT:     Head: Normocephalic.  Eyes:     Pupils: Pupils are equal, round, and reactive to light.  Cardiovascular:     Rate and Rhythm: Normal rate and regular rhythm.     Pulses: Normal pulses.     Heart sounds: Normal heart sounds. No murmur heard.    No friction rub. No gallop.  Pulmonary:     Effort: Pulmonary effort is normal.     Breath  sounds: Normal breath sounds.  Abdominal:     General: Abdomen is flat. There is no distension.     Palpations: Abdomen is soft.     Tenderness: There is no abdominal tenderness. There is no guarding or rebound.  Musculoskeletal:        General: Normal range of motion.     Cervical back: Neck supple.     Comments: Mild tenderness to lateral portion of right ankle.  Full range of motion of right ankle.  Pedal pulses palpable. Soft compartments.   Skin:    General: Skin is warm and dry.  Neurological:     General: No focal deficit present.     Mental Status: She is alert.  Psychiatric:        Mood and Affect: Mood normal.        Behavior: Behavior normal.     ED Results / Procedures / Treatments   Labs (all labs ordered are listed, but only abnormal results are displayed) Labs Reviewed - No data to display  EKG None  Radiology DG Foot Complete Right  Result Date: 01/29/2023 CLINICAL DATA:  Post fall, now with right foot and ankle pain. EXAM: RIGHT FOOT COMPLETE - 3+ VIEW COMPARISON:  Right ankle radiographs-earlier same day FINDINGS: No fracture or dislocation. Joint spaces are preserved. No significant hallux valgus deformity. No erosions. No plantar calcaneal spur. Regional soft tissues appear normal. No radiopaque foreign body. IMPRESSION: No explanation for patient's right foot pain. Specifically, no fracture or dislocation. Electronically Signed   By: Simonne Come M.D.   On: 01/29/2023 15:28   DG Ankle Complete Right  Result Date: 01/29/2023 CLINICAL DATA:  Post fall downstairs, now with right foot pain and swelling EXAM: RIGHT ANKLE - COMPLETE 3+ VIEW COMPARISON:  Right foot radiographs-earlier same day FINDINGS: No fracture or dislocation. Joint spaces are preserved. The ankle mortise is preserved. No definite ankle joint effusion. Minimal enthesopathic change involving the Achilles tendon insertion site. No plantar calcaneal spur. IMPRESSION: 1. No acute findings. 2. Minimal  enthesopathic change involving the Achilles tendon insertion site. Electronically Signed   By: Simonne Come M.D.   On: 01/29/2023 15:27    Procedures Procedures    Medications Ordered in ED Medications  acetaminophen (TYLENOL) tablet 650 mg (650 mg Oral Given 01/29/23 1334)    ED Course/ Medical Decision Making/ A&P                                 Medical Decision Making Amount and/or Complexity of Data Reviewed Radiology: ordered and independent interpretation performed. Decision-making details documented in ED  Course.  Risk OTC drugs.   27 year old female presents to the ED due to right ankle and foot pain after falling down 2-3 stairs.  No head injury.  Currently [redacted] weeks pregnant.  Did not fall on abdomen.  Denies vaginal bleeding or fluid from the vagina.  No other injuries.  Upon arrival, stable vitals.  Patient in no acute distress.  Slight tenderness to lateral portion of right ankle.  Full range of motion of right ankle.  Right lower extremity neurovascularly intact with soft compartments.  Low suspicion for compartment syndrome.  X-ray ordered which I personally reviewed and interpreted which are negative for any bony fractures. Agree with radiology interpretation. Patient given tylenol with improvement in pain.  Patient placed in lace up splint and given crutches as needed for comfort.  RICE discussed with patient.  Orthopedics number given to patient at discharge and advised to call to schedule an appointment if symptoms do not improve over the next week. Patient stable for discharge. Strict ED precautions discussed with patient. Patient states understanding and agrees to plan. Patient discharged home in no acute distress and stable vitals       Final Clinical Impression(s) / ED Diagnoses Final diagnoses:  Acute right ankle pain    Rx / DC Orders ED Discharge Orders     None         Jesusita Oka 01/29/23 1536    Alvira Monday, MD 01/29/23  2320

## 2023-01-29 NOTE — ED Triage Notes (Signed)
Reports ankle pain on right side after a fall down to steps. -head injury, -LOC. Ambulatory but endorses some pain. Reports she is pregnant EDD: 08/2023
